# Patient Record
Sex: Male | Born: 1950 | Race: White | Hispanic: No | Marital: Single | Smoking: Never smoker
Health system: Southern US, Community
[De-identification: ages and names within clinical notes are randomized; demographics above are authoritative.]

## PROBLEM LIST (undated history)

## (undated) DIAGNOSIS — M109 Gout, unspecified: Secondary | ICD-10-CM

## (undated) DIAGNOSIS — E7800 Pure hypercholesterolemia, unspecified: Secondary | ICD-10-CM

## (undated) DIAGNOSIS — K219 Gastro-esophageal reflux disease without esophagitis: Secondary | ICD-10-CM

## (undated) DIAGNOSIS — T753XXA Motion sickness, initial encounter: Secondary | ICD-10-CM

## (undated) DIAGNOSIS — Z972 Presence of dental prosthetic device (complete) (partial): Secondary | ICD-10-CM

## (undated) DIAGNOSIS — I10 Essential (primary) hypertension: Secondary | ICD-10-CM

## (undated) HISTORY — DX: Essential (primary) hypertension: I10

---

## 2004-12-09 ENCOUNTER — Ambulatory Visit: Payer: Self-pay

## 2016-09-21 ENCOUNTER — Other Ambulatory Visit: Payer: Self-pay | Admitting: Nurse Practitioner

## 2016-09-21 DIAGNOSIS — K209 Esophagitis, unspecified without bleeding: Secondary | ICD-10-CM

## 2016-09-21 DIAGNOSIS — R1310 Dysphagia, unspecified: Secondary | ICD-10-CM

## 2016-09-28 ENCOUNTER — Ambulatory Visit: Payer: Self-pay

## 2016-09-28 HISTORY — PX: COLONOSCOPY WITH ESOPHAGOGASTRODUODENOSCOPY (EGD): SHX5779

## 2018-04-23 ENCOUNTER — Encounter: Payer: Self-pay | Admitting: Internal Medicine

## 2018-04-23 ENCOUNTER — Inpatient Hospital Stay: Payer: Medicare HMO | Attending: Internal Medicine | Admitting: Internal Medicine

## 2018-04-23 DIAGNOSIS — D472 Monoclonal gammopathy: Secondary | ICD-10-CM | POA: Diagnosis present

## 2018-04-23 DIAGNOSIS — D649 Anemia, unspecified: Secondary | ICD-10-CM | POA: Insufficient documentation

## 2018-04-23 DIAGNOSIS — Z7289 Other problems related to lifestyle: Secondary | ICD-10-CM

## 2018-04-23 NOTE — Assessment & Plan Note (Addendum)
#  M protein 0.2 g/dL; incidental-mild anemia; no evidence of any other organ dysfunction or neuropathy.   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 Patient is less likely at this time patient has any active myeloma-although given anemia [see discussion below]  # MILD ANEMIA- Hb 13 [N-14]; likely secondary to alcohol.  Recommend alcohol in moderation.  Clinically not related to slightly elevated M protein.  #Follow-up in 6 months labs-1 week prior [CBC CMP M protein kappa lambda]  Thank you Dr.Theis for allowing me to participate in the care of your pleasant patient. Please do not hesitate to contact me with questions or concerns in the interim.  # 45 minutes face-to-face with the patient discussing the above plan of care; more than 50% of time spent on prognosis/ natural history; counseling and coordination.  Cc: Dr.Theis.  

## 2018-04-23 NOTE — Progress Notes (Signed)
[facility] NOTE  Patient Care Team: Ezequiel Kayser, MD as PCP - General (Internal Medicine)  CHIEF COMPLAINTS/PURPOSE OF CONSULTATION: MGUS  #  MGUS- M protein 0.2 gm/dl [incidental; PCP]; Hb-13; Normal renal/ca.  # Hx of alcohol  # screening: colo 1 year  No history exists.    HISTORY OF PRESENTING ILLNESS:  Eric Henderson 67 y.o.  male no significant past medical history except for hypertension hyperlipidemia has been referred to [place] for further evaluation recommendations for abnormal M protein/mild anemia.  Patient denies any unusual bone pain.  Denies any tingling or numbness.  No nausea no vomiting.  No weight loss.  Complains of mild fatigue.   Review of Systems  Constitutional: Positive for malaise/fatigue. Negative for chills, diaphoresis, fever and weight loss.  HENT: Negative for nosebleeds and sore throat.   Eyes: Negative for double vision.  Respiratory: Negative for cough, hemoptysis, sputum production, shortness of breath and wheezing.   Cardiovascular: Negative for chest pain, palpitations, orthopnea and leg swelling.  Gastrointestinal: Negative for abdominal pain, blood in stool, constipation, diarrhea, heartburn, melena, nausea and vomiting.  Genitourinary: Negative for dysuria, frequency and urgency.  Musculoskeletal: Negative for back pain and joint pain.  Skin: Negative.  Negative for itching and rash.  Neurological: Negative for dizziness, tingling, focal weakness, weakness and headaches.  Endo/Heme/Allergies: Does not bruise/bleed easily.  Psychiatric/Behavioral: Negative for depression. The patient is not nervous/anxious and does not have insomnia.      MEDICAL HISTORY:  Past Medical History:  Diagnosis Date  . Hypertension     SURGICAL HISTORY: History reviewed. No pertinent surgical history.  SOCIAL HISTORY: no smoking; drink alcohol [beer/liqor]l 6 drinks/day; in [place] lives with girl friend Social History    Socioeconomic History  . Marital status: Married    Spouse name: Not on file  . Number of children: Not on file  . Years of education: Not on file  . Highest education level: Not on file  Occupational History  . Not on file  Social Needs  . Financial resource strain: Not on file  . Food insecurity:    Worry: Not on file    Inability: Not on file  . Transportation needs:    Medical: Not on file    Non-medical: Not on file  Tobacco Use  . Smoking status: Never Smoker  . Smokeless tobacco: Never Used  Substance and Sexual Activity  . Alcohol use: Yes    Comment: 10 drinks daily/ beer & vodka  . Drug use: Never  . Sexual activity: Not on file  Lifestyle  . Physical activity:    Days per week: Not on file    Minutes per session: Not on file  . Stress: Not on file  Relationships  . Social connections:    Talks on phone: Not on file    Gets together: Not on file    Attends religious service: Not on file    Active member of club or organization: Not on file    Attends meetings of clubs or organizations: Not on file    Relationship status: Not on file  . Intimate partner violence:    Fear of current or ex partner: Not on file    Emotionally abused: Not on file    Physically abused: Not on file    Forced sexual activity: Not on file  Other Topics Concern  . Not on file  Social History Narrative  . Not on file  FAMILY HISTORY: no family hx of cancer No family history on file.  ALLERGIES:  has No Known Allergies.  MEDICATIONS:  Current Outpatient Medications  Medication Sig Dispense Refill  . lisinopril-hydrochlorothiazide (PRINZIDE,ZESTORETIC) 10-12.5 MG tablet TK 1 T PO QD  1  . lovastatin (MEVACOR) 40 MG tablet   1  . metoprolol succinate (TOPROL-XL) 25 MG 24 hr tablet Take by mouth.    . Omega-3 Fatty Acids (FISH OIL) 1000 MG CAPS Take by mouth.    Marland Kitchen omeprazole (PRILOSEC) 40 MG capsule TAKE 1 CAPSULE (40 MG TOTAL) BY MOUTH ONCE DAILY PATIENT NEEDS AN  APPOINTMENT FOR FURTHER REFILLS.  0  . vitamin B-12 (CYANOCOBALAMIN) 1000 MCG tablet Take by mouth.     No current facility-administered medications for this visit.       Marland Kitchen  PHYSICAL EXAMINATION: ECOG PERFORMANCE STATUS: 0 - Asymptomatic  Vitals:   04/23/18 1353  BP: 116/78  Pulse: 84  Resp: 16  Temp: 97.9 F (36.6 C)   Filed Weights   04/23/18 1353  Weight: 217 lb 13 oz (98.8 kg)    Physical Exam  Constitutional: He is oriented to person, place, and time and well-developed, well-nourished, and in no distress.  HENT:  Head: Normocephalic and atraumatic.  Mouth/Throat: Oropharynx is clear and moist. No oropharyngeal exudate.  Eyes: Pupils are equal, round, and reactive to light.  Neck: Normal range of motion. Neck supple.  Cardiovascular: Normal rate and regular rhythm.  Pulmonary/Chest: No respiratory distress. He has no wheezes.  Abdominal: Soft. Bowel sounds are normal. He exhibits no distension and no mass. There is no tenderness. There is no rebound and no guarding.  Musculoskeletal: Normal range of motion. He exhibits no edema or tenderness.  Neurological: He is alert and oriented to person, place, and time.  Skin: Skin is warm.  Psychiatric: Affect normal.     LABORATORY DATA:  I have reviewed the data as listed No results found for: WBC, HGB, HCT, MCV, PLT No results for input(s): NA, K, CL, CO2, GLUCOSE, BUN, CREATININE, CALCIUM, GFRNONAA, GFRAA, PROT, ALBUMIN, AST, ALT, ALKPHOS, BILITOT, BILIDIR, IBILI in the last 8760 hours.  RADIOGRAPHIC STUDIES: I have personally reviewed the radiological images as listed and agreed with the findings in the report. No results found.  ASSESSMENT & PLAN:   MGUS (monoclonal gammopathy of unknown significance) #M protein 0.2 g/dL; incidental-mild anemia; no evidence of any other organ dysfunction or neuropathy.   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 Patient is less likely at this time patient has any active myeloma-although given anemia [see discussion below]  # MILD ANEMIA- Hb 13 [N-14]; likely secondary to alcohol.  Recommend alcohol in moderation.  Clinically not related to slightly elevated M protein.  #Follow-up in 6 months labs-1 week prior [facility] CMP M protein kappa lambda]  Thank you Dr.Theis for allowing me to participate in the care of your pleasant patient. Please do not hesitate to contact me with questions or concerns in the interim.  # 45 minutes face-to-face with the patient discussing the above plan of care; more than 50% of time spent on prognosis/ natural history; counseling and coordination.  Cc: Dr.Theis.   All questions were answered. The patient knows to call the clinic with any problems, questions or concerns.    Cammie Sickle, MD 04/23/2018 2:40 PM

## 2018-10-06 ENCOUNTER — Other Ambulatory Visit: Payer: Self-pay

## 2018-10-06 ENCOUNTER — Ambulatory Visit
Admission: EM | Admit: 2018-10-06 | Discharge: 2018-10-06 | Disposition: A | Discharge status: Home / Self Care | Payer: Medicare HMO | Attending: Emergency Medicine | Admitting: Emergency Medicine

## 2018-10-06 ENCOUNTER — Encounter: Payer: Self-pay | Admitting: Emergency Medicine

## 2018-10-06 DIAGNOSIS — J111 Influenza due to unidentified influenza virus with other respiratory manifestations: Secondary | ICD-10-CM

## 2018-10-06 DIAGNOSIS — R69 Illness, unspecified: Principal | ICD-10-CM

## 2018-10-06 DIAGNOSIS — J101 Influenza due to other identified influenza virus with other respiratory manifestations: Secondary | ICD-10-CM

## 2018-10-06 LAB — RAPID INFLUENZA A&B ANTIGENS: Influenza B (ARMC): NEGATIVE

## 2018-10-06 LAB — RAPID INFLUENZA A&B ANTIGENS (ARMC ONLY): INFLUENZA A (ARMC): POSITIVE — AB

## 2018-10-06 MED ORDER — BALOXAVIR MARBOXIL(80 MG DOSE) 2 X 40 MG PO TBPK: 2.0000 | ORAL_TABLET | Freq: Once | ORAL | 0 refills | Status: AC

## 2018-10-06 MED ORDER — OSELTAMIVIR PHOSPHATE 75 MG PO CAPS: 75.0000 mg | ORAL_CAPSULE | Freq: Two times a day (BID) | ORAL | 0 refills | Status: DC

## 2018-10-06 MED ORDER — BENZONATATE 200 MG PO CAPS: ORAL_CAPSULE | ORAL | 0 refills | Status: DC

## 2018-10-06 MED ORDER — HYDROCOD POLST-CPM POLST ER 10-8 MG/5ML PO SUER: 5.0000 mL | Freq: Two times a day (BID) | ORAL | 0 refills | Status: DC

## 2018-10-06 NOTE — ED Triage Notes (Signed)
Patient c/o cough, chest congestion, fever, and bodyaches that started 2-3 days.

## 2018-10-06 NOTE — ED Provider Notes (Signed)
[facility]    CSN: [id number] Arrival date & time: 10/06/18  3810     History   Chief Complaint Chief Complaint  Patient presents with  . Cough    APPT  . Fever  . Generalized Body Aches    HPI Eric Henderson is a 68 y.o. male.   HPI  -year-old male accompanied by his partner presents with a cough, chest congestion, fever and body aches that started 2 days ago.  His girlfriend states that last night he was very hot in bed but did not take his temperature.  His pulse rate was rather high  according to her.  He did receive a flu shot earlier this year.  Temperature today is 100F pulse rate of 105 respirations 16 O2 sats at 97%.         Past Medical History:  Diagnosis Date  . Hypertension     Patient Active Problem List   Diagnosis Date Noted  . MGUS (monoclonal gammopathy of unknown significance) 04/23/2018    History reviewed. No pertinent surgical history.     Home Medications    Prior to Admission medications   Medication Sig Start Date End Date Taking? Authorizing Provider  lisinopril-hydrochlorothiazide (PRINZIDE,ZESTORETIC) 10-12.5 MG tablet TK 1 T PO QD 01/20/18  Yes [provider]  lovastatin (MEVACOR) 40 MG tablet  01/20/18  Yes [provider]  metoprolol succinate (TOPROL-XL) 25 MG 24 hr tablet Take by mouth. 04/05/18  Yes [provider]  Omega-3 Fatty Acids (FISH OIL) 1000 MG CAPS Take by mouth.   Yes [provider]  omeprazole (PRILOSEC) 40 MG capsule TAKE 1 CAPSULE (40 MG TOTAL) BY MOUTH ONCE DAILY PATIENT NEEDS AN APPOINTMENT FOR FURTHER REFILLS. 03/11/18  Yes [provider]  vitamin B-12 (CYANOCOBALAMIN) 1000 MCG tablet Take by mouth.   Yes [provider]  Baloxavir Marboxil,80 MG Dose, (XOFLUZA) 2 x 40 MG TBPK Take 2 tablets by mouth once for 1 dose. 10/06/18 10/06/18  Lorin Picket, PA-C  benzonatate (TESSALON) 200 MG capsule Take one cap TID PRN cough 10/06/18   Lorin Picket, PA-C  chlorpheniramine-HYDROcodone [facility] ER) 10-8 MG/5ML SUER Take 5 mLs by mouth 2 (two) times daily. 10/06/18   Lorin Picket, PA-C  oseltamivir (TAMIFLU) 75 MG capsule Take 1 capsule (75 mg total) by mouth every 12 (twelve) hours. 10/06/18   Lorin Picket, PA-C    Family History History reviewed. No pertinent family history.  Social History Social History   Tobacco Use  . Smoking status: Never Smoker  . Smokeless tobacco: Never Used  Substance Use Topics  . Alcohol use: Yes    Comment: 10 drinks daily/ beer & vodka  . Drug use: Never     Allergies   Patient has no known allergies.   Review of Systems Review of Systems  Constitutional: Positive for activity change, chills, fatigue and fever.  HENT: Positive for congestion, postnasal drip and rhinorrhea.   Respiratory: Positive for cough.   All other systems reviewed and are negative.    Physical Exam Triage Vital Signs ED Triage Vitals  Enc Vitals Group     BP 10/06/18 0916 104/80     Pulse Rate 10/06/18 0916 (!) 105     Resp 10/06/18 0916 16     Temp 10/06/18 0916 100 F (37.8 C)     Temp Source 10/06/18 0916 Oral     SpO2 10/06/18 0916 97 %  Weight 10/06/18 0913 220 lb (99.8 kg)     Height 10/06/18 0913 5\' 8"  (1.727 m)     Head Circumference --      Peak Flow --      Pain Score 10/06/18 0913 7     Pain Loc --      Pain Edu? --      Excl. in [place]? --    No data found.  Updated Vital Signs BP 104/80 (BP Location: Left Arm)   Pulse (!) 105   Temp 100 F (37.8 C) (Oral) Comment: Patient had Tylenol 1 hour ago.  Resp 16   Ht 5\' 8"  (1.727 m)   Wt 220 lb (99.8 kg)   SpO2 97%   BMI 33.45 kg/m   Visual Acuity Right Eye Distance:   Left Eye Distance:   Bilateral Distance:    Right Eye Near:   Left Eye Near:    Bilateral Near:     Physical Exam Vitals signs and nursing note reviewed.  Constitutional:      General: He is not in acute distress.    Appearance:  Normal appearance. He is not ill-appearing, toxic-appearing or diaphoretic.  HENT:     Head: Normocephalic and atraumatic.     Right Ear: Tympanic membrane, ear canal and external ear normal.     Left Ear: Tympanic membrane, ear canal and external ear normal.     Nose: Nose normal.     Mouth/Throat:     Mouth: Mucous membranes are moist.     Pharynx: Oropharynx is clear.  Eyes:     General:        Right eye: No discharge.        Left eye: No discharge.     Conjunctiva/sclera: Conjunctivae normal.  Neck:     Musculoskeletal: Normal range of motion and neck supple.  Pulmonary:     Effort: Pulmonary effort is normal.     Breath sounds: Normal breath sounds.  Musculoskeletal: Normal range of motion.  Lymphadenopathy:     Cervical: No cervical adenopathy.  Skin:    General: Skin is warm and dry.  Neurological:     General: No focal deficit present.     Mental Status: He is alert and oriented to person, place, and time.  Psychiatric:        Mood and Affect: Mood normal.        Behavior: Behavior normal.        Thought Content: Thought content normal.        Judgment: Judgment normal.      UC Treatments / Results  Labs (all labs ordered are listed, but only abnormal results are displayed) Labs Reviewed  RAPID INFLUENZA A&B ANTIGENS ([facility] ONLY) - Abnormal; Notable for the following components:      Result Value   Influenza A ([facility]) POSITIVE (*)    All other components within normal limits    EKG None  Radiology No results found.  Procedures Procedures (including critical care time)  Medications Ordered in UC Medications - No data to display  Initial Impression / Assessment and Plan / UC Course  I have reviewed the triage vital signs and the nursing notes.  Pertinent labs & imaging results that were available during my care of the patient were reviewed by me and considered in my medical decision making (see chart for details).   She has positive flu a.  Treat  with Tamiflu.  Also provide him with a cough suppressants.  Needs to drink plenty of fluids get adequate rest and take Tylenol or Motrin for fever chills or body aches   Final Clinical Impressions(s) / UC Diagnoses   Final diagnoses:  Influenza-like illness     Discharge Instructions     Drink plenty of fluids.  Rest as much as possible.  Use Tylenol or Motrin for fever chills or body aches.    ED Prescriptions    Medication Sig Dispense Auth. Provider   Baloxavir Marboxil,80 MG Dose, (XOFLUZA) 2 x 40 MG TBPK Take 2 tablets by mouth once for 1 dose. 1 each Lorin Picket, PA-C   oseltamivir (TAMIFLU) 75 MG capsule Take 1 capsule (75 mg total) by mouth every 12 (twelve) hours. 10 capsule Lorin Picket, PA-C   benzonatate (TESSALON) 200 MG capsule Take one cap TID PRN cough 30 capsule Lorin Picket, PA-C   chlorpheniramine-HYDROcodone (TUSSIONEX PENNKINETIC ER) 10-8 MG/5ML SUER Take 5 mLs by mouth 2 (two) times daily. 115 mL Lorin Picket, PA-C     Controlled Substance Prescriptions [place] Controlled Substance Registry consulted? Not Applicable   Lorin Picket, PA-C 10/06/18 5638

## 2018-10-06 NOTE — Discharge Instructions (Signed)
Drink plenty of fluids.  Rest as much as possible.  Use Tylenol or Motrin for fever chills or body aches.  °

## 2018-10-22 ENCOUNTER — Inpatient Hospital Stay: Payer: Medicare HMO | Admitting: Internal Medicine

## 2018-10-22 ENCOUNTER — Inpatient Hospital Stay: Payer: Medicare HMO

## 2018-10-28 ENCOUNTER — Inpatient Hospital Stay: Payer: Medicare HMO

## 2018-10-29 ENCOUNTER — Inpatient Hospital Stay: Payer: Medicare HMO | Attending: Urgent Care

## 2018-10-29 DIAGNOSIS — D649 Anemia, unspecified: Secondary | ICD-10-CM | POA: Diagnosis not present

## 2018-10-29 DIAGNOSIS — D472 Monoclonal gammopathy: Secondary | ICD-10-CM | POA: Diagnosis present

## 2018-10-29 LAB — COMPREHENSIVE METABOLIC PANEL
ALT: 19 U/L (ref 0–44)
ANION GAP: 5 (ref 5–15)
AST: 21 U/L (ref 15–41)
Albumin: 3.7 g/dL (ref 3.5–5.0)
Alkaline Phosphatase: 52 U/L (ref 38–126)
BUN: 17 mg/dL (ref 8–23)
CO2: 28 mmol/L (ref 22–32)
Calcium: 8.6 mg/dL — ABNORMAL LOW (ref 8.9–10.3)
Chloride: 104 mmol/L (ref 98–111)
Creatinine, Ser: 0.99 mg/dL (ref 0.61–1.24)
GFR calc Af Amer: >60 mL/min (ref >60)
GFR calc non Af Amer: >60 mL/min (ref >60)
Glucose, Bld: 110 mg/dL — ABNORMAL HIGH (ref 70–99)
Potassium: 4.1 mmol/L (ref 3.5–5.1)
Sodium: 137 mmol/L (ref 135–145)
TOTAL PROTEIN: 7.2 g/dL (ref 6.5–8.1)
Total Bilirubin: 1.1 mg/dL (ref 0.3–1.2)

## 2018-10-29 LAB — CBC WITH DIFFERENTIAL/PLATELET
Abs Immature Granulocytes: 0.02 10*3/uL (ref 0.00–0.07)
Basophils Absolute: 0 10*3/uL (ref 0.0–0.1)
Basophils Relative: 1 %
Eosinophils Absolute: 0.1 10*3/uL (ref 0.0–0.5)
Eosinophils Relative: 2 %
HCT: 38.2 % — ABNORMAL LOW (ref 39.0–52.0)
Hemoglobin: 12.6 g/dL — ABNORMAL LOW (ref 13.0–17.0)
Immature Granulocytes: 0 %
Lymphocytes Relative: 48 %
Lymphs Abs: 2.4 10*3/uL (ref 0.7–4.0)
MCH: 32.6 pg (ref 26.0–34.0)
MCHC: 33 g/dL (ref 30.0–36.0)
MCV: 99 fL (ref 80.0–100.0)
Monocytes Absolute: 0.7 10*3/uL (ref 0.1–1.0)
Monocytes Relative: 13 %
Neutro Abs: 1.8 10*3/uL (ref 1.7–7.7)
Neutrophils Relative %: 36 %
Platelets: 233 10*3/uL (ref 150–400)
RBC: 3.86 MIL/uL — ABNORMAL LOW (ref 4.22–5.81)
RDW: 13.1 % (ref 11.5–15.5)
WBC: 5 10*3/uL (ref 4.0–10.5)
nRBC: 0 % (ref 0.0–0.2)

## 2018-10-30 LAB — KAPPA/LAMBDA LIGHT CHAINS
Kappa free light chain: 23.4 mg/L — ABNORMAL HIGH (ref 3.3–19.4)
Kappa, lambda light chain ratio: 0.84 (ref 0.26–1.65)
Lambda free light chains: 28 mg/L — ABNORMAL HIGH (ref 5.7–26.3)

## 2018-10-30 LAB — MULTIPLE MYELOMA PANEL, SERUM
Albumin SerPl Elph-Mcnc: 3.5 g/dL (ref 2.9–4.4)
Albumin/Glob SerPl: 1.3 (ref 0.7–1.7)
Alpha 1: 0.3 g/dL (ref 0.0–0.4)
Alpha2 Glob SerPl Elph-Mcnc: 0.7 g/dL (ref 0.4–1.0)
B-GLOBULIN SERPL ELPH-MCNC: 1.3 g/dL (ref 0.7–1.3)
GLOBULIN, TOTAL: 2.9 g/dL (ref 2.2–3.9)
Gamma Glob SerPl Elph-Mcnc: 0.7 g/dL (ref 0.4–1.8)
IgA: 526 mg/dL — ABNORMAL HIGH (ref 61–437)
IgG (Immunoglobin G), Serum: 946 mg/dL (ref 700–1600)
IgM (Immunoglobulin M), Srm: 148 mg/dL (ref 20–172)
M PROTEIN SERPL ELPH-MCNC: 0.3 g/dL — AB
Total Protein ELP: 6.4 g/dL (ref 6.0–8.5)

## 2018-10-31 ENCOUNTER — Other Ambulatory Visit: Payer: Self-pay

## 2018-10-31 DIAGNOSIS — D472 Monoclonal gammopathy: Secondary | ICD-10-CM

## 2018-11-02 NOTE — Progress Notes (Signed)
[facility] day:  11/04/2018  Chief Complaint: Eric Henderson is a 68 y.o. male with an IgA monoclonal gammopathy who is seen for new patient assessment.  HPI:   He was seen by Dr Raechel Ache on 04/05/2018.  He was noted to have chronic anemia.  CBC revealed a hematocrit of 40.2, hemoglobin 13.4, and MCV 99.  B12 was 545 and ferritin 60.  M-spike was 0.2 gm/dL on 04/05/2018.  Calcium and creatinine were normal.     The patient was last seen in the hematology clinic by Dr. Rogue Bussing on 04/23/2018 for initial assessment.  He was referred for a monoclonal protein noted incidentally.  Symptomatically, he noted mild fatigue.  He was felt to have a monoclonal gammopathy of unknown significance (MGUS) with no end organ dysfunction.  Alcohol in moderation was suggested as well as surveillance.  Labs on 10/29/2018 included a hematocrit of 38.2, hemoglobin 12.6, MCV 99.0, platelets 233,000, WBC 5000 with an ANC of 1800.  Creatinine was 0.99.  Calcium was 8.6.  LFTs were normal.  SPEP revealed a 0.3 gm/dL IgA monoclonal protein with lambda light chain specificity.  IgA was 526 (61-437).  IgG was 946.  Kappa free light chains were 23.4, lambda free light chains were 28.0 with a ratio of 0.84 (normal).  During the interim, he has done well.  He notes "I'm ok, I am always tired.  I am just worn out".  He states that he remains active working.  He lays brick.  He denies any B symptoms.  He drinks "4-5 Vodka and tonic per night and a smattering of beer".  He states that his diet is mostly burgers and a "smattering of salads".   Past Medical History:  Diagnosis Date  . Hypertension     History reviewed. No pertinent surgical history.  History reviewed. No pertinent family history.  Social History:  reports that he has never smoked. He has never used smokeless tobacco. He reports current alcohol use. He reports that he does not use drugs.  He drinks 4-5 Vodka and tonic  per night and a smattering of beer.  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  He works with a buddy on Music therapist, Social research officer, government. He works variable hours depending on the job.  He lives in [place].  The patient is alone today.  Allergies: No Known Allergies  Current Medications: Current Outpatient Medications  Medication Sig Dispense Refill  . lisinopril-hydrochlorothiazide (PRINZIDE,ZESTORETIC) 10-12.5 MG tablet TK 1 T PO QD  1  . lovastatin (MEVACOR) 40 MG tablet Take 40 mg by mouth at bedtime.   1  . metoprolol succinate (TOPROL-XL) 25 MG 24 hr tablet Take 25 mg by mouth daily.     . Omega-3 Fatty Acids (FISH OIL) 1000 MG CAPS Take 1 capsule by mouth daily.     Marland Kitchen omeprazole (PRILOSEC) 40 MG capsule TAKE 1 CAPSULE (40 MG TOTAL) BY MOUTH ONCE DAILY PATIENT NEEDS AN APPOINTMENT FOR FURTHER REFILLS.  0  . vitamin B-12 (CYANOCOBALAMIN) 1000 MCG tablet Take 1,000 mcg by mouth daily.      No current facility-administered medications for this visit.     Review of Systems:  GENERAL:  Feels "worn out" secondary to work.  No fevers, sweats.  Weight up 1 pound. PERFORMANCE STATUS (ECOG):  1 HEENT:  No visual changes, runny nose, sore throat, mouth sores or tenderness. Lungs: No shortness of breath or cough.  No hemoptysis.  Cardiac:  No chest pain, palpitations, orthopnea, or PND. GI:  No nausea, vomiting, diarrhea, constipation, melena or hematochezia. GU:  No urgency, frequency, dysuria, or hematuria. Musculoskeletal:  No back pain.  Knees hurt secondary to bending a lot.  No muscle tenderness. Extremities:  No pain or swelling. Skin:  No rashes or skin changes. Neuro:  No headache, numbness or weakness, balance or coordination issues. Endocrine:  No diabetes, thyroid issues, hot flashes or night sweats. Psych:  No mood changes, depression or anxiety. Pain:  No focal pain. Review of systems:  All other systems reviewed and found to be negative.  Physical Exam: Blood pressure  126/80, pulse (!) 59, temperature 97.7 F (36.5 C), resp. rate 16, weight 218 lb 7.6 oz (99.1 kg), SpO2 99 %. GENERAL:  Well developed, well nourished, gentleman sitting comfortably in the exam room in no acute distress. MENTAL STATUS:  Alert and oriented to person, place and time. HEAD:  Lilyan Punt and mustache.  Normocephalic, atraumatic, face symmetric, no Cushingoid features. EYES:  Blue eyes.  Pupils equal round and reactive to light and accomodation.  No conjunctivitis or scleral icterus. ENT:  Oropharynx clear without lesion.  Tongue normal. Mucous membranes moist.  RESPIRATORY:  Clear to auscultation without rales, wheezes or rhonchi. CARDIOVASCULAR:  Regular rate and rhythm without murmur, rub or gallop. ABDOMEN:  Soft, non-tender, with active bowel sounds, and no appreciable hepatosplenomegaly.  No masses. SKIN:  No rashes, ulcers or lesions. EXTREMITIES: No edema, no skin discoloration or tenderness.  No palpable cords. LYMPH NODES: No palpable cervical, supraclavicular, axillary or inguinal adenopathy  NEUROLOGICAL: Unremarkable. PSYCH:  Appropriate.   No visits with results within 3 Day(s) from this visit.  Latest known visit with results is:  Appointment on 10/29/2018  Component Date Value Ref Range Status  . IgG (Immunoglobin G), Serum 10/29/2018 946  700 - 1,600 mg/dL Final   Comment: (NOTE)    **Effective November 18, 2018, Immunoglobulin G, Qn,**      Serum reference interval will be changing to:             Age                Male          Male         0  - 10 days        496 - 9562     130 - 1231        11d -  6 months      175 -  639     184 -  697          7 - 11 months      261 -  865     784 -  787          1 -  3 years       428 - 6962     952 - 8413          4 -  6 years       538 - 2440     102 - 7253          6 -  9 years       72 - 6440     347 - 1350         10 - 11 years       601 - 4259     563 - 8756  12 - 13 years       610 - 2426     834 -  1433         14 - 15 years       630 - 1962     229 - 7989         21 - 19 years       671 - 1941     740 - 8144             >19 years       603 - 1613     586 - 1602   . IgA 10/29/2018 526* 61 - 437 mg/dL Final  . IgM (Immunoglobulin M), Srm 10/29/2018 148  20 - 172 mg/dL Final  . Total Protein ELP 10/29/2018 6.4  6.0 - 8.5 g/dL Corrected  . Albumin SerPl Elph-Mcnc 10/29/2018 3.5  2.9 - 4.4 g/dL Corrected  . Alpha 1 10/29/2018 0.3  0.0 - 0.4 g/dL Corrected  . Alpha2 Glob SerPl Elph-Mcnc 10/29/2018 0.7  0.4 - 1.0 g/dL Corrected  . B-Globulin SerPl Elph-Mcnc 10/29/2018 1.3  0.7 - 1.3 g/dL Corrected  . Gamma Glob SerPl Elph-Mcnc 10/29/2018 0.7  0.4 - 1.8 g/dL Corrected  . M Protein SerPl Elph-Mcnc 10/29/2018 0.3* Not Observed g/dL Corrected  . Globulin, Total 10/29/2018 2.9  2.2 - 3.9 g/dL Corrected  . Albumin/Glob SerPl 10/29/2018 1.3  0.7 - 1.7 Corrected  . IFE 1 10/29/2018 Comment   Corrected   Comment: (NOTE) Immunofixation shows IgA monoclonal protein with lambda light chain specificity.   . Please Note 10/29/2018 Comment   Corrected   Comment: (NOTE) Protein electrophoresis scan will follow via computer, mail, or courier delivery. Performed At: [facility] [place], [place] [id number] Rush Farmer MD [id number]   . Kappa free light chain 10/29/2018 23.4* 3.3 - 19.4 mg/L Final  . Lamda free light chains 10/29/2018 28.0* 5.7 - 26.3 mg/L Final  . Kappa, lamda light chain ratio 10/29/2018 0.84  0.26 - 1.65 Final   Comment: (NOTE) Performed At: [facility] [place], [place] [id number] Rush Farmer MD [id number]   . Sodium 10/29/2018 137  135 - 145 mmol/L Final  . Potassium 10/29/2018 4.1  3.5 - 5.1 mmol/L Final  . Chloride 10/29/2018 104  98 - 111 mmol/L Final  . CO2 10/29/2018 28  22 - 32 mmol/L Final  . Glucose, Bld 10/29/2018 110* 70 - 99 mg/dL Final  . BUN 10/29/2018 17  8 - 23 mg/dL Final  . Creatinine, Ser  10/29/2018 0.99  0.61 - 1.24 mg/dL Final  . Calcium 10/29/2018 8.6* 8.9 - 10.3 mg/dL Final  . Total Protein 10/29/2018 7.2  6.5 - 8.1 g/dL Final  . Albumin 10/29/2018 3.7  3.5 - 5.0 g/dL Final  . AST 10/29/2018 21  15 - 41 U/L Final  . ALT 10/29/2018 19  0 - 44 U/L Final  . Alkaline Phosphatase 10/29/2018 52  38 - 126 U/L Final  . Total Bilirubin 10/29/2018 1.1  0.3 - 1.2 mg/dL Final  . GFR calc non Af Amer 10/29/2018 >60  >60 mL/min Final  . GFR calc Af Amer 10/29/2018 >60  >60 mL/min Final  . Anion gap 10/29/2018 5  5 - 15 Final   Performed at [facility] [place], [address] [place], [place] [address]  . WBC 10/29/2018 5.0  4.0 - 10.5 K/uL Final  . RBC 10/29/2018 3.86* 4.22 - 5.81 MIL/uL Final  .  Hemoglobin 10/29/2018 12.6* 13.0 - 17.0 g/dL Final  . HCT 10/29/2018 38.2* 39.0 - 52.0 % Final  . MCV 10/29/2018 99.0  80.0 - 100.0 fL Final  . MCH 10/29/2018 32.6  26.0 - 34.0 pg Final  . MCHC 10/29/2018 33.0  30.0 - 36.0 g/dL Final  . RDW 10/29/2018 13.1  11.5 - 15.5 % Final  . Platelets 10/29/2018 233  150 - 400 K/uL Final  . nRBC 10/29/2018 0.0  0.0 - 0.2 % Final  . Neutrophils Relative % 10/29/2018 36  % Final  . Neutro Abs 10/29/2018 1.8  1.7 - 7.7 K/uL Final  . Lymphocytes Relative 10/29/2018 48  % Final  . Lymphs Abs 10/29/2018 2.4  0.7 - 4.0 K/uL Final  . Monocytes Relative 10/29/2018 13  % Final  . Monocytes Absolute 10/29/2018 0.7  0.1 - 1.0 K/uL Final  . Eosinophils Relative 10/29/2018 2  % Final  . Eosinophils Absolute 10/29/2018 0.1  0.0 - 0.5 K/uL Final  . Basophils Relative 10/29/2018 1  % Final  . Basophils Absolute 10/29/2018 0.0  0.0 - 0.1 K/uL Final  . Immature Granulocytes 10/29/2018 0  % Final  . Abs Immature Granulocytes 10/29/2018 0.02  0.00 - 0.07 K/uL Final   Performed at [facility], [address], [place], [place] [address]    Assessment:  XYLAN SHEILS is a 68 y.o. male with an IgA monoclonal gammopathy with lambda light chain  specificity first noted in 03/2018 on evaluation of anemia.  Labs on 10/29/2018 included a hematocrit of 38.2, hemoglobin 12.6, MCV 99.0, platelets 233,000, WBC 5000 with an ANC of 1800.  Creatinine and calcium were normal.    SPEP has been followed (gm/dL):  0.2 on 04/05/2018 and 0.3 on 10/29/2018.  IgA was 526 (64-680) on 10/29/2018.  Lambda free light chains were 28.0 (ratio 0.84) on 10/29/2018.  Symptomatically, he feels "worn out" secondary to his job.  He denies any B symptoms or infections.  He denies any bone pain.  Exam is unremarkable.  Plan: 1.   Review labs from 10/29/2018.   2.   IgA monoclonal gammopathy  M-spike remains low.  No evidence of end organ damage (CRAB criteria).  Discuss IgA monoclonal gammopathies as compared to others.  Bone marrow considered in patients with IgA MGUS of any size.  For patients with M-spike < 1.5 gm/dL, marrow involvement >= 10% seen 5% - 20% in IgA gammopathies.  Discuss consideration of 12 hour UPEP with free light chains and bone survey.  Patient is in agreement.  Discuss follow-up every 6 months for MGUS and every 3 months for patients classified with smoldering myeloma. 3.   Collect 24 hour urine for UPEP and free light chains. 4.   Bone survey. 5.   RTC in 6 months for MD assessment, labs (CBC with diff, CMP, myeloma panel, FLCA, LDH, beta2-microglobulin).   Lequita Asal, MD  11/04/2018, 10:40 AM

## 2018-11-04 ENCOUNTER — Encounter: Payer: Self-pay | Admitting: Hematology and Oncology

## 2018-11-04 ENCOUNTER — Other Ambulatory Visit: Payer: Self-pay

## 2018-11-04 ENCOUNTER — Inpatient Hospital Stay (HOSPITAL_BASED_OUTPATIENT_CLINIC_OR_DEPARTMENT_OTHER): Payer: Medicare HMO | Admitting: Hematology and Oncology

## 2018-11-04 VITALS — BP 126/80 | HR 59 | Temp 97.7°F | Resp 16 | Wt 218.5 lb

## 2018-11-04 DIAGNOSIS — D649 Anemia, unspecified: Secondary | ICD-10-CM | POA: Diagnosis not present

## 2018-11-04 DIAGNOSIS — D472 Monoclonal gammopathy: Secondary | ICD-10-CM

## 2018-11-04 NOTE — Progress Notes (Signed)
Pt here for follow up. Reports lower abdominal pain that has been present for "awhile" that comes and goes. States he will make PCP aware of problem.

## 2019-05-07 ENCOUNTER — Encounter: Payer: Self-pay | Admitting: Hematology and Oncology

## 2019-05-07 ENCOUNTER — Inpatient Hospital Stay: Payer: Medicare HMO | Attending: Hematology and Oncology | Admitting: Hematology and Oncology

## 2019-05-07 ENCOUNTER — Inpatient Hospital Stay: Payer: Medicare HMO

## 2019-06-23 ENCOUNTER — Other Ambulatory Visit: Payer: Self-pay | Admitting: Physician Assistant

## 2019-06-23 DIAGNOSIS — M2392 Unspecified internal derangement of left knee: Secondary | ICD-10-CM

## 2019-06-23 DIAGNOSIS — G8929 Other chronic pain: Secondary | ICD-10-CM

## 2019-06-23 DIAGNOSIS — M25562 Pain in left knee: Secondary | ICD-10-CM

## 2019-07-03 ENCOUNTER — Other Ambulatory Visit: Payer: Self-pay

## 2019-07-03 ENCOUNTER — Ambulatory Visit
Admission: RE | Admit: 2019-07-03 | Discharge: 2019-07-03 | Disposition: A | Discharge status: Home / Self Care | Payer: Medicare HMO | Source: Ambulatory Visit | Attending: Physician Assistant | Admitting: Physician Assistant

## 2019-07-03 DIAGNOSIS — M25561 Pain in right knee: Secondary | ICD-10-CM | POA: Diagnosis present

## 2019-07-03 DIAGNOSIS — G8929 Other chronic pain: Secondary | ICD-10-CM | POA: Diagnosis present

## 2019-07-03 DIAGNOSIS — M2392 Unspecified internal derangement of left knee: Secondary | ICD-10-CM | POA: Diagnosis not present

## 2019-07-03 DIAGNOSIS — M25562 Pain in left knee: Secondary | ICD-10-CM | POA: Insufficient documentation

## 2019-07-30 ENCOUNTER — Other Ambulatory Visit: Payer: Self-pay | Admitting: Orthopedic Surgery

## 2019-08-20 ENCOUNTER — Other Ambulatory Visit: Payer: Self-pay

## 2019-08-20 ENCOUNTER — Encounter: Payer: Self-pay | Admitting: Orthopedic Surgery

## 2019-08-27 ENCOUNTER — Other Ambulatory Visit: Payer: Self-pay

## 2019-08-27 ENCOUNTER — Other Ambulatory Visit
Admission: RE | Admit: 2019-08-27 | Discharge: 2019-08-27 | Disposition: A | Discharge status: Home / Self Care | Payer: Medicare HMO | Source: Ambulatory Visit | Attending: Orthopedic Surgery | Admitting: Orthopedic Surgery

## 2019-08-27 DIAGNOSIS — Z20822 Contact with and (suspected) exposure to covid-19: Secondary | ICD-10-CM | POA: Diagnosis not present

## 2019-08-27 DIAGNOSIS — Z01812 Encounter for preprocedural laboratory examination: Secondary | ICD-10-CM | POA: Diagnosis present

## 2019-08-27 LAB — SARS CORONAVIRUS 2 (TAT 6-24 HRS): SARS Coronavirus 2: NEGATIVE

## 2019-08-29 ENCOUNTER — Ambulatory Visit
Admission: RE | Admit: 2019-08-29 | Discharge: 2019-08-29 | Disposition: A | Discharge status: Home / Self Care | Payer: Medicare HMO | Attending: Orthopedic Surgery | Admitting: Orthopedic Surgery

## 2019-08-29 ENCOUNTER — Encounter: Payer: Self-pay | Admitting: Orthopedic Surgery

## 2019-08-29 ENCOUNTER — Encounter
Admission: RE | Disposition: A | Discharge status: Home / Self Care | Payer: Self-pay | Source: Home / Self Care | Attending: Orthopedic Surgery

## 2019-08-29 ENCOUNTER — Other Ambulatory Visit: Payer: Self-pay

## 2019-08-29 ENCOUNTER — Ambulatory Visit: Payer: Medicare HMO | Admitting: Anesthesiology

## 2019-08-29 DIAGNOSIS — S83232A Complex tear of medial meniscus, current injury, left knee, initial encounter: Secondary | ICD-10-CM | POA: Insufficient documentation

## 2019-08-29 DIAGNOSIS — X501XXA Overexertion from prolonged static or awkward postures, initial encounter: Secondary | ICD-10-CM | POA: Diagnosis not present

## 2019-08-29 DIAGNOSIS — Z79899 Other long term (current) drug therapy: Secondary | ICD-10-CM | POA: Insufficient documentation

## 2019-08-29 DIAGNOSIS — I10 Essential (primary) hypertension: Secondary | ICD-10-CM | POA: Diagnosis not present

## 2019-08-29 DIAGNOSIS — Z6836 Body mass index (BMI) 36.0-36.9, adult: Secondary | ICD-10-CM | POA: Diagnosis not present

## 2019-08-29 DIAGNOSIS — E7800 Pure hypercholesterolemia, unspecified: Secondary | ICD-10-CM | POA: Diagnosis not present

## 2019-08-29 DIAGNOSIS — K219 Gastro-esophageal reflux disease without esophagitis: Secondary | ICD-10-CM | POA: Insufficient documentation

## 2019-08-29 HISTORY — DX: Gout, unspecified: M10.9

## 2019-08-29 HISTORY — DX: Motion sickness, initial encounter: T75.3XXA

## 2019-08-29 HISTORY — DX: Presence of dental prosthetic device (complete) (partial): Z97.2

## 2019-08-29 HISTORY — PX: KNEE ARTHROSCOPY WITH MEDIAL MENISECTOMY: SHX5651

## 2019-08-29 HISTORY — DX: Gastro-esophageal reflux disease without esophagitis: K21.9

## 2019-08-29 HISTORY — DX: Pure hypercholesterolemia, unspecified: E78.00

## 2019-08-29 SURGERY — ARTHROSCOPY, KNEE, WITH MEDIAL MENISCECTOMY
Anesthesia: General | Site: Knee | Laterality: Left

## 2019-08-29 MED ORDER — FENTANYL CITRATE (PF) 100 MCG/2ML IJ SOLN
INTRAMUSCULAR | Status: DC | PRN
  Administered 2019-08-29: 50 ug via INTRAVENOUS
  Administered 2019-08-29: 100 ug via INTRAVENOUS

## 2019-08-29 MED ORDER — IBUPROFEN 800 MG PO TABS: 800.0000 mg | ORAL_TABLET | Freq: Three times a day (TID) | ORAL | 1 refills | Status: AC

## 2019-08-29 MED ORDER — CEFAZOLIN SODIUM-DEXTROSE 2-4 GM/100ML-% IV SOLN
2.0000 g | INTRAVENOUS | Status: AC
  Administered 2019-08-29: 2 g via INTRAVENOUS

## 2019-08-29 MED ORDER — ONDANSETRON 4 MG PO TBDP: 4.0000 mg | ORAL_TABLET | Freq: Three times a day (TID) | ORAL | 0 refills | Status: DC | PRN

## 2019-08-29 MED ORDER — DEXAMETHASONE SODIUM PHOSPHATE 4 MG/ML IJ SOLN
INTRAMUSCULAR | Status: DC | PRN
  Administered 2019-08-29: 4 mg via INTRAVENOUS

## 2019-08-29 MED ORDER — GLYCOPYRROLATE 0.2 MG/ML IJ SOLN
INTRAMUSCULAR | Status: DC | PRN
  Administered 2019-08-29: .1 mg via INTRAVENOUS

## 2019-08-29 MED ORDER — LIDOCAINE-EPINEPHRINE 1 %-1:100000 IJ SOLN
INTRAMUSCULAR | Status: DC | PRN
  Administered 2019-08-29: 12 mL
  Administered 2019-08-29: 1.5 mL

## 2019-08-29 MED ORDER — MIDAZOLAM HCL 5 MG/5ML IJ SOLN
INTRAMUSCULAR | Status: DC | PRN
  Administered 2019-08-29: 2 mg via INTRAVENOUS

## 2019-08-29 MED ORDER — HYDROCODONE-ACETAMINOPHEN 5-325 MG PO TABS: 1.0000 | ORAL_TABLET | ORAL | 0 refills | Status: DC | PRN

## 2019-08-29 MED ORDER — ONDANSETRON HCL 4 MG/2ML IJ SOLN
INTRAMUSCULAR | Status: DC | PRN
  Administered 2019-08-29: 4 mg via INTRAVENOUS

## 2019-08-29 MED ORDER — BUPIVACAINE HCL (PF) 0.5 % IJ SOLN
INTRAMUSCULAR | Status: DC | PRN
  Administered 2019-08-29: 12 mL
  Administered 2019-08-29: 1.5 mL

## 2019-08-29 MED ORDER — LACTATED RINGERS IV SOLN: INTRAVENOUS | Status: DC

## 2019-08-29 MED ORDER — LABETALOL HCL 5 MG/ML IV SOLN
INTRAVENOUS | Status: DC | PRN
  Administered 2019-08-29: 5 mg via INTRAVENOUS

## 2019-08-29 MED ORDER — LIDOCAINE HCL (CARDIAC) PF 100 MG/5ML IV SOSY
PREFILLED_SYRINGE | INTRAVENOUS | Status: DC | PRN
  Administered 2019-08-29: 40 mg via INTRATRACHEAL

## 2019-08-29 MED ORDER — ASPIRIN EC 325 MG PO TBEC: 325.0000 mg | DELAYED_RELEASE_TABLET | Freq: Every day | ORAL | 0 refills | Status: AC

## 2019-08-29 MED ORDER — ACETAMINOPHEN 500 MG PO TABS: 1000.0000 mg | ORAL_TABLET | Freq: Three times a day (TID) | ORAL | 2 refills | Status: AC

## 2019-08-29 MED ORDER — PROPOFOL 10 MG/ML IV BOLUS
INTRAVENOUS | Status: DC | PRN
  Administered 2019-08-29: 200 mg via INTRAVENOUS

## 2019-08-29 MED ORDER — CHLORHEXIDINE GLUCONATE 4 % EX LIQD
60.0000 mL | Freq: Once | CUTANEOUS | Status: AC
  Administered 2019-08-29: 4 via TOPICAL

## 2019-08-29 SURGICAL SUPPLY — 37 items
ADAPTER IRRIG TUBE 2 SPIKE SOL (ADAPTER) ×3 IMPLANT
BLADE SURG SZ11 CARB STEEL (BLADE) ×2 IMPLANT
BNDG COHESIVE 4X5 TAN STRL (GAUZE/BANDAGES/DRESSINGS) ×2 IMPLANT
BNDG ESMARK 6X12 TAN STRL LF (GAUZE/BANDAGES/DRESSINGS) ×2 IMPLANT
BUR RADIUS 3.5 (BURR) IMPLANT
BUR RADIUS 4.0X18.5 (BURR) ×1 IMPLANT
CHLORAPREP W/TINT 26 (MISCELLANEOUS) ×2 IMPLANT
COOLER POLAR GLACIER W/PUMP (MISCELLANEOUS) ×2 IMPLANT
COVER LIGHT HANDLE UNIVERSAL (MISCELLANEOUS) ×4 IMPLANT
CUFF TOURN SGL QUICK 30 (TOURNIQUET CUFF) ×1
CUFF TRNQT CYL 30X4X21-28X (TOURNIQUET CUFF) IMPLANT
DRAPE IMP U-DRAPE 54X76 (DRAPES) ×2 IMPLANT
GAUZE SPONGE 4X4 12PLY STRL (GAUZE/BANDAGES/DRESSINGS) ×2 IMPLANT
GLOVE BIO SURGEON STRL SZ7.5 (GLOVE) ×3 IMPLANT
GLOVE BIOGEL PI IND STRL 8 (GLOVE) ×1 IMPLANT
GLOVE BIOGEL PI INDICATOR 8 (GLOVE) ×2
GOWN STRL REIN 2XL XLG LVL4 (GOWN DISPOSABLE) ×2 IMPLANT
GOWN STRL REUS W/ TWL LRG LVL3 (GOWN DISPOSABLE) ×1 IMPLANT
GOWN STRL REUS W/TWL LRG LVL3 (GOWN DISPOSABLE) ×1
IV LACTATED RINGER IRRG 3000ML (IV SOLUTION) ×4
IV LR IRRIG 3000ML ARTHROMATIC (IV SOLUTION) ×4 IMPLANT
KIT TURNOVER KIT A (KITS) ×2 IMPLANT
MANIFOLD NEPTUNE II (INSTRUMENTS) ×2 IMPLANT
MAT ABSORB  FLUID 56X50 GRAY (MISCELLANEOUS) ×1
MAT ABSORB FLUID 56X50 GRAY (MISCELLANEOUS) ×1 IMPLANT
PACK ARTHROSCOPY KNEE (MISCELLANEOUS) ×2 IMPLANT
PAD WRAPON POLAR KNEE (MISCELLANEOUS) ×1 IMPLANT
PADDING CAST BLEND 6X4 STRL (MISCELLANEOUS) ×1 IMPLANT
PADDING STRL CAST 6IN (MISCELLANEOUS) ×1
SET TUBE SUCT SHAVER OUTFL 24K (TUBING) ×2 IMPLANT
SET TUBE TIP INTRA-ARTICULAR (MISCELLANEOUS) ×2 IMPLANT
SUT ETHILON 3-0 FS-10 30 BLK (SUTURE) ×2
SUTURE EHLN 3-0 FS-10 30 BLK (SUTURE) ×1 IMPLANT
TOWEL OR 17X26 4PK STRL BLUE (TOWEL DISPOSABLE) ×3 IMPLANT
TUBING ARTHRO INFLOW-ONLY STRL (TUBING) ×2 IMPLANT
WAND WEREWOLF FLOW 90D (MISCELLANEOUS) ×1 IMPLANT
WRAPON POLAR PAD KNEE (MISCELLANEOUS) ×2

## 2019-08-29 NOTE — Op Note (Signed)
Operative Note    SURGERY DATE: 08/29/2019   PRE-OP DIAGNOSIS:  1. Left medial meniscus tear 2. Left tricompartmental degenerative changes   POST-OP DIAGNOSIS:  1. Left medial meniscus tear 2. Left tricompartmental degenerative changes   PROCEDURES:  1.  Left knee arthroscopy, partial medial meniscectomy 2.  Chondroplasty of patellofemora and medial compartments   SURGEON: Cato Mulligan, MD   ANESTHESIA: Gen   ESTIMATED BLOOD LOSS: minimal   TOTAL IV FLUIDS: per anesthesia   INDICATION(S):  Eric Henderson is a 69 y.o. male with signs and symptoms as well as MRI finding of medial meniscus tear.  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  He had failed nonoperative measures.  After discussion of risks, benefits, and alternatives to surgery, the patient elected to proceed.    OPERATIVE FINDINGS:    Examination under anesthesia: A careful examination under anesthesia was performed.  Passive range of motion was: Hyperextension: 2.  Extension: 0.  Flexion: 130.  Lachman: normal. Pivot Shift: normal.  Posterior drawer: normal.  Varus stability in full extension: normal.  Varus stability in 30 degrees of flexion: normal.  Valgus stability in full extension: normal.  Valgus stability in 30 degrees of flexion: normal.   Intra-operative findings: A thorough arthroscopic examination of the knee was performed.  The findings are: 1. Suprapatellar pouch: Normal 2. Undersurface of median ridge: Grade 2 degenerative changes 3. Medial patellar facet: Grade 1 softening 4. Lateral patellar facet: Grade 1 softening 5. Trochlea: Significant area of grade 4 degenerative changes centrally measuring approximately 12 x 10 mm 6. Lateral gutter/popliteus tendon: Normal 7. Hoffa's fat pad: Inflamed 8. Medial gutter/plica: Normal 9. ACL: Normal 10. PCL: Normal 11. Medial meniscus: Complex tear with  small radial tears of the  posterior horn and body with extension along the meniscus undersurface affecting approximately 50% of the meniscus width 12. Medial compartment cartilage: Focal area of grade 2-3 degenerative changes to the medial femoral condyle measuring approximately 8 x 8 mm.  Diffuse grade 1 degenerative changes the tibial plateau and tibial plateau 13. Lateral meniscus: Normal 14. Lateral compartment cartilage: Grade 1-2 degenerative changes to the tibial plateau.  Normal lateral femoral condyle   OPERATIVE REPORT:     I identified Eric Henderson in the pre-operative holding area. I marked the operative knee with my initials. I reviewed the risks and benefits of the proposed surgical intervention and the patient (and/or patient's guardian) wished to proceed. The patient was transferred to the operative suite and placed in the supine position with all bony prominences padded.  Anesthesia was administered. Appropriate IV antibiotics were administered prior to incision. The extremity was then prepped and draped in standard fashion. A time out was performed confirming the correct extremity, correct patient, and correct procedure.   Arthroscopy portals were marked. Local anesthetic was injected to the planned portal sites. The anterolateral portal was established with an 11 blade.      The arthroscope was placed in the anterolateral portal and then into the suprapatellar pouch.  A diagnostic knee scope was completed with the above findings. The medial meniscus tear was identified.   Next the medial portal was established under needle localization. The MCL was pie-crusted to improve visualization of the posterior horn. The meniscal tear was debrided using an arthroscopic biter and an oscillating shaver until the meniscus had stable borders.  After debridement, approximately 75% of the  posterior horn width remained.  A chondroplasty was performed of the medial compartment and patellofemoral compartment such that there  were stable cartilage edges without any loose fragments of cartilage. Arthroscopic fluid was removed from the joint.   The portals were closed with 3-0 Nylon suture. Sterile dressings included Xeroform, 4x4s, Sof-Rol, and Bias wrap. A Polarcare was placed.  The patient was then awakened and taken to the PACU hemodynamically stable without complication.     POSTOPERATIVE PLAN: The patient will be discharged home today once they meet PACU criteria. Aspirin 325 mg daily was prescribed for 2 weeks for DVT prophylaxis.  Physical therapy will start on POD#3-4. Weight-bearing as tolerated. Follow up in 2 weeks per protocol.

## 2019-08-29 NOTE — Anesthesia Preprocedure Evaluation (Signed)
Anesthesia Evaluation  Patient identified by MRN, date of birth, ID band Patient awake    History of Anesthesia Complications Negative for: history of anesthetic complications  Airway Mallampati: III  TM Distance: >3 FB Neck ROM: Full    Dental no notable dental hx.    Pulmonary neg pulmonary ROS,    Pulmonary exam normal        Cardiovascular Exercise Tolerance: Good hypertension, (-) anginaNormal cardiovascular exam     Neuro/Psych negative neurological ROS  negative psych ROS   GI/Hepatic GERD  Controlled and Medicated,(+)     substance abuse (~5 alcoholic beverages per day)  ,   Endo/Other  Morbid obesity  Renal/GU negative Renal ROS     Musculoskeletal negative musculoskeletal ROS (+)   Abdominal (+) + obese,   Peds  Hematology negative hematology ROS (+)   Anesthesia Other Findings   Reproductive/Obstetrics                             Anesthesia Physical Anesthesia Plan  ASA: II  Anesthesia Plan: General   Post-op Pain Management:    Induction:   PONV Risk Score and Plan: 2 and Midazolam, Ondansetron and Treatment may vary due to age or medical condition  Airway Management Planned: LMA  Additional Equipment: None  Intra-op Plan:   Post-operative Plan: Extubation in OR  Informed Consent: I have reviewed the patients History and Physical, chart, labs and discussed the procedure including the risks, benefits and alternatives for the proposed anesthesia with the patient or authorized representative who has indicated his/her understanding and acceptance.       Plan Discussed with: CRNA  Anesthesia Plan Comments:         Anesthesia Quick Evaluation

## 2019-08-29 NOTE — Anesthesia Procedure Notes (Signed)
Procedure Name: LMA Insertion Date/Time: 08/29/2019 12:44 PM Performed by: Mayme Genta, CRNA Pre-anesthesia Checklist: Patient identified, Emergency Drugs available, Suction available, Timeout performed and Patient being monitored Patient Re-evaluated:Patient Re-evaluated prior to induction Oxygen Delivery Method: Circle system utilized Preoxygenation: Pre-oxygenation with 100% oxygen Induction Type: IV induction LMA: LMA inserted LMA Size: 4.0 Number of attempts: 1 Placement Confirmation: positive ETCO2 and breath sounds checked- equal and bilateral Tube secured with: Tape

## 2019-08-29 NOTE — Anesthesia Postprocedure Evaluation (Signed)
Anesthesia Post Note  Patient: Eric Henderson  Procedure(s) Performed: LEFT KNEE ARTHROSCOPIC PARTIAL MEDIAL MENISCECTOMY (Left Knee)     Patient location during evaluation: PACU Anesthesia Type: General Level of consciousness: awake and alert Pain management: pain level controlled Vital Signs Assessment: post-procedure vital signs reviewed and stable Respiratory status: spontaneous breathing, nonlabored ventilation, respiratory function stable and patient connected to nasal cannula oxygen Cardiovascular status: blood pressure returned to baseline and stable Postop Assessment: no apparent nausea or vomiting Anesthetic complications: no    Adele Barthel Britny Riel

## 2019-08-29 NOTE — Transfer of Care (Signed)
Immediate Anesthesia Transfer of Care Note  Patient: Eric Henderson  Procedure(s) Performed: LEFT KNEE ARTHROSCOPIC PARTIAL MEDIAL MENISCECTOMY (Left Knee)  Patient Location: PACU  Anesthesia Type: General  Level of Consciousness: awake, alert  and patient cooperative  Airway and Oxygen Therapy: Patient Spontanous Breathing and Patient connected to supplemental oxygen  Post-op Assessment: Post-op Vital signs reviewed, Patient's Cardiovascular Status Stable, Respiratory Function Stable, Patent Airway and No signs of Nausea or vomiting  Post-op Vital Signs: Reviewed and stable  Complications: No apparent anesthesia complications

## 2019-08-29 NOTE — Discharge Instructions (Signed)
Arthroscopic Knee Surgery - Partial Meniscectomy   Post-Op Instructions   1. Bracing or crutches: Crutches will be provided at the time of discharge from the surgery center if you do not already have them.   2. Ice: You may be provided with a device [facility]) that allows you to ice the affected area effectively. Otherwise you can ice manually.    3. Driving:  Plan on not driving for at least two weeks. Please note that you are advised NOT to drive while taking narcotic pain medications as you may be impaired and unsafe to drive.   4. Activity: Ankle pumps several times an hour while awake to prevent blood clots. Weight bearing: as tolerated. Use crutches for as needed (usually ~1 week or less) until pain allows you to ambulate without a limp. Bending and straightening the knee is unlimited. Elevate knee above heart level as much as possible for one week. Avoid standing more than 5 minutes (consecutively) for the first week.  Avoid long distance travel for 2 weeks.  5. Medications:  - You have been provided a prescription for narcotic pain medicine. After surgery, take 1-2 narcotic tablets every 4 hours if needed for severe pain.  - You may take up to 3000mg /day of tylenol (acetaminophen). You can take 1000mg  3x/day. Please check your narcotic. If you have acetaminophen in your narcotic (each tablet will be 325mg ), be careful not to exceed a total of 3000mg /day of acetaminophen.  - A prescription for anti-nausea medication will be provided in case the narcotic medicine or anesthesia causes nausea - take 1 tablet every 6 hours only if nauseated.  - Take ibuprofen 800 mg every 8 hours WITH food to reduce post-operative knee swelling. DO NOT STOP IBUPROFEN POST-OP UNTIL INSTRUCTED TO DO SO at first post-op office visit (10-14 days after surgery). However, please discontinue if you have any abdominal discomfort after taking this.  - Take enteric coated aspirin 325 mg once daily for 2 weeks to prevent  blood clots.    6. Bandages: The physical therapist should change the bandages at the first post-op appointment. If needed, the dressing supplies have been provided to you.   7. Physical Therapy: 1-2 times per week for 6 weeks. Therapy typically starts on post operative Day 3 or 4. You have been provided an order for physical therapy. The therapist will provide home exercises.   8. Work: May return to full work usually around 2 weeks after 1st post-operative visit. May do light duty/desk job in approximately 1-2 weeks when off of narcotics, pain is well-controlled, and swelling has decreased. Labor intensive jobs may require 4-6 weeks to return.      9. Post-Op Appointments: Your first post-op appointment will be with Dr. Posey Pronto in approximately 2 weeks time.    If you find that they have not been scheduled please call the Orthopaedic Appointment front desk at [phone].    General Anesthesia, Adult, Care After This sheet gives you information about how to care for yourself after your procedure. Your health care provider may also give you more specific instructions. If you have problems or questions, contact your health care provider. What can I expect after the procedure? After the procedure, the following side effects are common:  Pain or discomfort at the IV site.  Nausea.  Vomiting.  Sore throat.  Trouble concentrating.  Feeling cold or chills.  Weak or tired.  Sleepiness and fatigue.  Soreness and body aches. These side effects can affect parts of  the body that were not involved in surgery. Follow these instructions at home:  For at least 24 hours after the procedure:  Have a responsible adult stay with you. It is important to have someone help care for you until you are awake and alert.  Rest as needed.  Do not: ? Participate in activities in which you could fall or become injured. ? Drive. ? Use heavy machinery. ? Drink alcohol. ? Take sleeping pills or  medicines that cause drowsiness. ? Make important decisions or sign legal documents. ? Take care of children on your own. Eating and drinking  Follow any instructions from your health care provider about eating or drinking restrictions.  When you feel hungry, start by eating small amounts of foods that are soft and easy to digest (bland), such as toast. Gradually return to your regular diet.  Drink enough fluid to keep your urine pale yellow.  If you vomit, rehydrate by drinking water, juice, or clear broth. General instructions  If you have sleep apnea, surgery and certain medicines can increase your risk for breathing problems. Follow instructions from your health care provider about wearing your sleep device: ? Anytime you are sleeping, including during daytime naps. ? While taking prescription pain medicines, sleeping medicines, or medicines that make you drowsy.  Return to your normal activities as told by your health care provider. Ask your health care provider what activities are safe for you.  Take over-the-counter and prescription medicines only as told by your health care provider.  If you smoke, do not smoke without supervision.  Keep all follow-up visits as told by your health care provider. This is important. Contact a health care provider if:  You have nausea or vomiting that does not get better with medicine.  You cannot eat or drink without vomiting.  You have pain that does not get better with medicine.  You are unable to pass urine.  You develop a skin rash.  You have a fever.  You have redness around your IV site that gets worse. Get help right away if:  You have difficulty breathing.  You have chest pain.  You have blood in your urine or stool, or you vomit blood. Summary  After the procedure, it is common to have a sore throat or nausea. It is also common to feel tired.  Have a responsible adult stay with you for the first 24 hours after general  anesthesia. It is important to have someone help care for you until you are awake and alert.  When you feel hungry, start by eating small amounts of foods that are soft and easy to digest (bland), such as toast. Gradually return to your regular diet.  Drink enough fluid to keep your urine pale yellow.  Return to your normal activities as told by your health care provider. Ask your health care provider what activities are safe for you. This information is not intended to replace advice given to you by your health care provider. Make sure you discuss any questions you have with your health care provider. Document Released: 11/13/2000 Document Revised: 08/10/2017 Document Reviewed: 03/23/2017 Elsevier Patient Education  2020 [employer].

## 2019-08-29 NOTE — H&P (Signed)
Paper H&P to be scanned into permanent record. H&P reviewed. No significant changes noted.  

## 2019-09-22 ENCOUNTER — Encounter: Payer: Self-pay | Admitting: Hematology and Oncology

## 2019-09-22 ENCOUNTER — Other Ambulatory Visit: Payer: Self-pay

## 2019-09-22 NOTE — Progress Notes (Signed)
No new changes noted today. The patient name and DOB has been verified by phone today. 

## 2019-09-22 NOTE — Progress Notes (Signed)
[facility]  [address] [place],  [place] [address] Phone: [phone]  Fax: [phone]   Clinic Day:  09/23/2019  Referring physician: Ezequiel Kayser, MD  Chief Complaint: Eric Henderson is a 69 y.o. male with IgA monoclonal gammopathy who is seen for a 11 month assessment.  HPI: The patient was last seen in the medical oncology clinic on 11/04/2018 as a new patient. At that time, he felt "worn out" secondary to his job.  He denied any B symptoms or infections.  He denied any bone pain.  Exam was unremarkable. CBC revealed a hematocrit 38.2, hemoglobin 12.6, platelets 233,000, WBC 5,00. Calcium was 8.6. IgA was 526. M spike was 0.3 gm/dL. Kappa free light chains were 23.4, lambda free light chains 28.0, and ratio 0.84 (normal).  Patient agreed to a 24 hour UPEP with free light chains and a bone survey.   The patient had an initial consult with Vance Peper, PA in orthopaedics on 06/23/2019 for his chronic bilateral knee pain.  He was started on meloxicam.  Left knee MRI on 07/03/2019 showed meniscocapsular injury vs far peripheral longitudinal tear involving the posterior horn of the medial meniscus. There were also small free edge tears involving the mid body region. There was intact ligamentous structures and no acute bony findings. There was no joint effusion. There was a small Baker's cyst and mild medial compartment degenerative chondrosis.  Patient underwent left knee arthroscopy, partial medial meniscectomy and a chondroplasty of patellofemora and medial compartments on 08/29/2019 by Dr. Posey Pronto .  He is being seen by Durward Mallard, PT at [facility] s/p left knee surgery.   During the interim, the patient felt "alright". His left knee feels better. He denies any infections. 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 He denies bleeding of any kind. He denies any adenopathy. He notes constant ringing in his ears related to hunting.    Patient is not taking any calcium pills.    Past Medical History:  Diagnosis Date  . Dental bridge present    upper  . GERD (gastroesophageal reflux disease)   . Gout   . Hypercholesteremia   . Hypertension   . Motion sickness    swings    Past Surgical History:  Procedure Laterality Date  . COLONOSCOPY WITH ESOPHAGOGASTRODUODENOSCOPY (EGD)  09/28/2016  . KNEE ARTHROSCOPY WITH MEDIAL MENISECTOMY Left 08/29/2019   Procedure: LEFT KNEE ARTHROSCOPIC PARTIAL MEDIAL MENISCECTOMY;  Surgeon: Leim Fabry, MD;  Location: [facility];  Service: Orthopedics;  Laterality: Left;    History reviewed. No pertinent family history.  Social History:  reports that he has never smoked. He has never used smokeless tobacco. He reports current alcohol use of about 70.0 standard drinks of alcohol per week. He reports that he does not use drugs. He drinks 4-5 Vodka and tonic per night and a smattering of beer.  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  He works with a buddy on Music therapist, Social research officer, government. He works variable hours depending on the job.  He lives in [place]. The patient is alone today.  Allergies: No Known Allergies  Current Medications: Current Outpatient Medications  Medication Sig Dispense Refill  . acetaminophen (TYLENOL) 500 MG tablet Take 2 tablets (1,000 mg total) by mouth every 8 (eight) hours. 90 tablet 2  . HYDROcodone-acetaminophen (NORCO) 5-325 MG tablet Take 1-2 tablets by mouth every 4 (four) hours as needed for moderate pain or severe pain.  10 tablet 0  . lisinopril-hydrochlorothiazide (PRINZIDE,ZESTORETIC) 10-12.5 MG tablet TK 1 T PO QD  1  . lovastatin (MEVACOR) 40 MG tablet Take 40 mg by mouth at bedtime.   1  . metoprolol succinate (TOPROL-XL) 25 MG 24 hr tablet Take 25 mg by mouth daily.     . Omega-3 Fatty Acids (FISH OIL) 1000 MG CAPS Take 1 capsule by mouth daily.     Marland Kitchen omeprazole (PRILOSEC) 40 MG capsule TAKE 1 CAPSULE (40 MG TOTAL) BY MOUTH ONCE  DAILY PATIENT NEEDS AN APPOINTMENT FOR FURTHER REFILLS.  0  . vitamin B-12 (CYANOCOBALAMIN) 1000 MCG tablet Take 1,000 mcg by mouth daily.     . ondansetron (ZOFRAN ODT) 4 MG disintegrating tablet Take 1 tablet (4 mg total) by mouth every 8 (eight) hours as needed for nausea or vomiting. (Patient not taking: Reported on 09/22/2019) 20 tablet 0   No current facility-administered medications for this visit.    Review of Systems  Constitutional: Negative for chills, diaphoresis, fever, malaise/fatigue and weight loss.       Feels "alright".  HENT: Positive for tinnitus (costant). Negative for congestion, ear pain, hearing loss, nosebleeds and sore throat.   Eyes: Negative.  Negative for blurred vision and double vision.  Respiratory: Negative.  Negative for cough, hemoptysis, sputum production and shortness of breath.   Cardiovascular: Negative.  Negative for chest pain, palpitations and leg swelling.  Gastrointestinal: Negative.  Negative for abdominal pain, blood in stool, constipation, diarrhea, heartburn, melena, nausea and vomiting.  Genitourinary: Negative.  Negative for dysuria, frequency, hematuria and urgency.  Musculoskeletal: Negative.  Negative for joint pain (left knee; better), myalgias and neck pain.  Skin: Negative.  Negative for itching and rash.  Neurological: Negative.  Negative for dizziness, tingling, sensory change, weakness and headaches.  Endo/Heme/Allergies: Negative.  Does not bruise/bleed easily.  Psychiatric/Behavioral: Negative.  Negative for depression and memory loss. The patient is not nervous/anxious and does not have insomnia.   All other systems reviewed and are negative.  Performance status (ECOG): 0-1  Vitals Blood pressure 131/90, pulse 68, temperature (!) 96.9 F (36.1 C), temperature source Tympanic, resp. rate 18, height 5\' 7"  (1.702 m), weight 235 lb 5.5 oz (106.8 kg), SpO2 98 %.   Physical Exam  Constitutional: He is oriented to person, place, and  time. He appears well-developed and well-nourished. No distress.  HENT:  Head: Normocephalic and atraumatic.  Mouth/Throat: Oropharynx is clear and moist. No oropharyngeal exudate.  Gray hair and [employer].  Eyes: Pupils are equal, round, and reactive to light. Conjunctivae and EOM are normal. No scleral icterus.  Blue eyes.  Neck: No JVD present.  Cardiovascular: Normal rate, regular rhythm and normal heart sounds.  No murmur heard. Pulmonary/Chest: Effort normal and breath sounds normal. No respiratory distress. He has no wheezes. He has no rales. He exhibits no tenderness.  Abdominal: Soft. Bowel sounds are normal. He exhibits no distension and no mass. There is no abdominal tenderness. There is no rebound and no guarding.  Musculoskeletal:        General: No tenderness or edema. Normal range of motion.     Cervical back: Normal range of motion and neck supple.  Lymphadenopathy:       Head (right side): No preauricular, no posterior auricular and no occipital adenopathy present.       Head (left side): No preauricular, no posterior auricular and no occipital adenopathy present.    He has no cervical adenopathy.    He has  no axillary adenopathy.       Right: No inguinal and no supraclavicular adenopathy present.       Left: No inguinal and no supraclavicular adenopathy present.  Neurological: He is alert and oriented to person, place, and time.  Skin: Skin is warm and dry. No rash noted. He is not diaphoretic. No erythema. No pallor.  Psychiatric: He has a normal mood and affect. His behavior is normal. Judgment and thought content normal.  Nursing note and vitals reviewed.   Appointment on 09/23/2019  Component Date Value Ref Range Status  . LDH 09/23/2019 120  98 - 192 U/L Final   Performed at [facility], [address], [place], [place] [address]  . Sodium 09/23/2019 136  135 - 145 mmol/L Final  . Potassium 09/23/2019 3.6  3.5 - 5.1 mmol/L Final  . Chloride  09/23/2019 103  98 - 111 mmol/L Final  . CO2 09/23/2019 29  22 - 32 mmol/L Final  . Glucose, Bld 09/23/2019 135* 70 - 99 mg/dL Final  . BUN 09/23/2019 15  8 - 23 mg/dL Final  . Creatinine, Ser 09/23/2019 1.03  0.61 - 1.24 mg/dL Final  . Calcium 09/23/2019 8.7* 8.9 - 10.3 mg/dL Final  . Total Protein 09/23/2019 7.3  6.5 - 8.1 g/dL Final  . Albumin 09/23/2019 3.9  3.5 - 5.0 g/dL Final  . AST 09/23/2019 28  15 - 41 U/L Final  . ALT 09/23/2019 32  0 - 44 U/L Final  . Alkaline Phosphatase 09/23/2019 62  38 - 126 U/L Final  . Total Bilirubin 09/23/2019 0.6  0.3 - 1.2 mg/dL Final  . GFR calc non Af Amer 09/23/2019 >60  >60 mL/min Final  . GFR calc Af Amer 09/23/2019 >60  >60 mL/min Final  . Anion gap 09/23/2019 4* 5 - 15 Final   Performed at [facility], [address], [place], [place] [address]  . WBC 09/23/2019 5.5  4.0 - 10.5 K/uL Final  . RBC 09/23/2019 4.13* 4.22 - 5.81 MIL/uL Final  . Hemoglobin 09/23/2019 13.4  13.0 - 17.0 g/dL Final  . HCT 09/23/2019 39.7  39.0 - 52.0 % Final  . MCV 09/23/2019 96.1  80.0 - 100.0 fL Final  . MCH 09/23/2019 32.4  26.0 - 34.0 pg Final  . MCHC 09/23/2019 33.8  30.0 - 36.0 g/dL Final  . RDW 09/23/2019 12.5  11.5 - 15.5 % Final  . Platelets 09/23/2019 244  150 - 400 K/uL Final  . nRBC 09/23/2019 0.0  0.0 - 0.2 % Final  . Neutrophils Relative % 09/23/2019 41  % Final  . Neutro Abs 09/23/2019 2.2  1.7 - 7.7 K/uL Final  . Lymphocytes Relative 09/23/2019 45  % Final  . Lymphs Abs 09/23/2019 2.5  0.7 - 4.0 K/uL Final  . Monocytes Relative 09/23/2019 11  % Final  . Monocytes Absolute 09/23/2019 0.6  0.1 - 1.0 K/uL Final  . Eosinophils Relative 09/23/2019 2  % Final  . Eosinophils Absolute 09/23/2019 0.1  0.0 - 0.5 K/uL Final  . Basophils Relative 09/23/2019 1  % Final  . Basophils Absolute 09/23/2019 0.0  0.0 - 0.1 K/uL Final  . Immature Granulocytes 09/23/2019 0  % Final  . Abs Immature Granulocytes 09/23/2019 0.01  0.00 - 0.07 K/uL Final    Performed at [facility], [address] [place], [place] [address]    Assessment:  Eric Henderson is a 69 y.o. 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  Labs on 10/29/2018 included a hematocrit of 38.2, hemoglobin 12.6, MCV 99.0, platelets 233,000, WBC 5000 with an ANC of 1800.  Creatinine and calcium were normal.    SPEP has been followed (gm/dL):  0.2 on 04/05/2018, 0.3 on 10/29/2018, and 0.3 on 09/23/2019.  IgA was 526 (61-437) on 10/29/2018 and 561 on 09/23/2019.  Lambda free light chains were 28.0 (ratio 0.84) on 10/29/2018 and 28.5 (ratio 0.71 on 09/23/2019).  Symptomatically, he is doing well.  Exam is stable.  Plan: 1.   Labs today:  CBC with diff, CMP, myeloma panel, FLCA, beta 2-microglobin, LDH. 2.   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    M-spike is stable at 0.3 gm/dL.  Beta-2 microglobulin is 1.7 and LDH 120.             There is no evidence of endorgan damage (CRAB criteria).             Suspect monoclonal gammopathy of unknown significance (MGUS).  Discuss ongoing surveillance. 3.   Collect 24-hour urine for UPEP and free light chains. 4.   Bone survey. 5.   RTC in 6 months for MD assessment and labs (CBC with differential, CMP, SPEP, FLCA).   Addendum: Bone survey on 09/23/2019 revealed osteopenia versus artifact in the lumbar spine, ribs, and pelvis.  There was no discrete lytic or destructive osseous lesion.  There was mild calcified aortic sclerosis atherosclerosis.  I discussed the assessment and treatment plan with the patient.  The patient was provided an opportunity to ask questions and all were answered.  The patient agreed with the plan and demonstrated an understanding of the instructions.  The patient was advised to call back if the symptoms worsen or if the condition fails to improve as  anticipated.   Lequita Asal, MD, PhD    09/23/2019, 10:29 AM  I, Selena Batten, am acting as scribe for [employer]. Mike Gip, MD, PhD.  I, Mattye Verdone C. Mike Gip, MD, have reviewed the above documentation for accuracy and completeness, and I agree with the above.

## 2019-09-23 ENCOUNTER — Inpatient Hospital Stay: Payer: Medicare HMO

## 2019-09-23 ENCOUNTER — Inpatient Hospital Stay: Payer: Medicare HMO | Attending: Hematology and Oncology | Admitting: Hematology and Oncology

## 2019-09-23 ENCOUNTER — Encounter: Payer: Self-pay | Admitting: Hematology and Oncology

## 2019-09-23 ENCOUNTER — Ambulatory Visit
Admission: RE | Admit: 2019-09-23 | Discharge: 2019-09-23 | Disposition: A | Discharge status: Home / Self Care | Payer: Medicare HMO | Attending: Hematology and Oncology | Admitting: Hematology and Oncology

## 2019-09-23 ENCOUNTER — Ambulatory Visit
Admission: RE | Admit: 2019-09-23 | Discharge: 2019-09-23 | Disposition: A | Discharge status: Home / Self Care | Payer: Medicare HMO | Source: Ambulatory Visit | Attending: Hematology and Oncology | Admitting: Hematology and Oncology

## 2019-09-23 VITALS — BP 131/90 | HR 68 | Temp 96.9°F | Resp 18 | Ht 67.0 in | Wt 235.3 lb

## 2019-09-23 DIAGNOSIS — D649 Anemia, unspecified: Secondary | ICD-10-CM | POA: Insufficient documentation

## 2019-09-23 DIAGNOSIS — D472 Monoclonal gammopathy: Secondary | ICD-10-CM

## 2019-09-23 DIAGNOSIS — M8580 Other specified disorders of bone density and structure, unspecified site: Secondary | ICD-10-CM | POA: Insufficient documentation

## 2019-09-23 LAB — COMPREHENSIVE METABOLIC PANEL
ALT: 32 U/L (ref 0–44)
AST: 28 U/L (ref 15–41)
Albumin: 3.9 g/dL (ref 3.5–5.0)
Alkaline Phosphatase: 62 U/L (ref 38–126)
Anion gap: 4 — ABNORMAL LOW (ref 5–15)
BUN: 15 mg/dL (ref 8–23)
CO2: 29 mmol/L (ref 22–32)
Calcium: 8.7 mg/dL — ABNORMAL LOW (ref 8.9–10.3)
Chloride: 103 mmol/L (ref 98–111)
Creatinine, Ser: 1.03 mg/dL (ref 0.61–1.24)
GFR calc Af Amer: >60 mL/min (ref >60)
GFR calc non Af Amer: >60 mL/min (ref >60)
Glucose, Bld: 135 mg/dL — ABNORMAL HIGH (ref 70–99)
Potassium: 3.6 mmol/L (ref 3.5–5.1)
Sodium: 136 mmol/L (ref 135–145)
Total Bilirubin: 0.6 mg/dL (ref 0.3–1.2)
Total Protein: 7.3 g/dL (ref 6.5–8.1)

## 2019-09-23 LAB — CBC WITH DIFFERENTIAL/PLATELET
Abs Immature Granulocytes: 0.01 10*3/uL (ref 0.00–0.07)
Basophils Absolute: 0 10*3/uL (ref 0.0–0.1)
Basophils Relative: 1 %
Eosinophils Absolute: 0.1 10*3/uL (ref 0.0–0.5)
Eosinophils Relative: 2 %
HCT: 39.7 % (ref 39.0–52.0)
Hemoglobin: 13.4 g/dL (ref 13.0–17.0)
Immature Granulocytes: 0 %
Lymphocytes Relative: 45 %
Lymphs Abs: 2.5 10*3/uL (ref 0.7–4.0)
MCH: 32.4 pg (ref 26.0–34.0)
MCHC: 33.8 g/dL (ref 30.0–36.0)
MCV: 96.1 fL (ref 80.0–100.0)
Monocytes Absolute: 0.6 10*3/uL (ref 0.1–1.0)
Monocytes Relative: 11 %
Neutro Abs: 2.2 10*3/uL (ref 1.7–7.7)
Neutrophils Relative %: 41 %
Platelets: 244 10*3/uL (ref 150–400)
RBC: 4.13 MIL/uL — ABNORMAL LOW (ref 4.22–5.81)
RDW: 12.5 % (ref 11.5–15.5)
WBC: 5.5 10*3/uL (ref 4.0–10.5)
nRBC: 0 % (ref 0.0–0.2)

## 2019-09-23 LAB — LACTATE DEHYDROGENASE: LDH: 120 U/L (ref 98–192)

## 2019-09-23 NOTE — Patient Instructions (Signed)
Monoclonal Gammopathy of Undetermined Significance (MGUS) Monoclonal gammopathy of undetermined significance (MGUS) is a condition in which there is too much of a protein called monoclonal protein, or M protein, in the blood. MGUS can cause you to have too many cells in your blood and not enough space for healthy cells. This condition may increase your risk of developing multiple myeloma or other blood disorders in the future. What are the causes? The cause of this condition is not known. What increases the risk? You are more likely to develop this condition if:  You are African American.  You are age 60 or older.  You are male.  You have an autoimmune disease.  You have been exposed to radiation.  You have a family history of MGUS. What are the signs or symptoms? There are no symptoms of this condition. How is this diagnosed?  This condition may be diagnosed with a blood test that checks for M protein. How is this treated? Treatment for this condition may involve:  Having regular exams. This will allow your health care provider to monitor your health.  Having tests done regularly, such as: ? Blood tests to check for M protein in your body. ? Imaging tests, such as a CT scan. ? A bone marrow biopsy. This test involves taking a sample of bone marrow from your body so it can be looked at under a microscope. Follow these instructions at home:  Keep all follow-up visits as told by your health care provider. This is important. Contact a health care provider if:  You have trouble swallowing.  You have pain in your back or ribs.  You have a fever.  You are bruising easily. Get help right away if:  You break a bone.  You have trouble breathing. Summary  Monoclonal gammopathy of undetermined significance (MGUS) is a condition in which there is too much of a protein called monoclonal protein, or M protein, in the blood.  This condition may be diagnosed with a blood test  that checks for M protein.  Treatment for this condition may involve having tests done regularly. Tests may include blood tests, imaging tests, and a bone marrow biopsy. This information is not intended to replace advice given to you by your health care provider. Make sure you discuss any questions you have with your health care provider. Document Revised: 09/30/2018 Document Reviewed: 06/28/2016 Elsevier Patient Education  2020 [employer].

## 2019-09-24 LAB — KAPPA/LAMBDA LIGHT CHAINS
Kappa free light chain: 20.2 mg/L — ABNORMAL HIGH (ref 3.3–19.4)
Kappa, lambda light chain ratio: 0.71 (ref 0.26–1.65)
Lambda free light chains: 28.5 mg/L — ABNORMAL HIGH (ref 5.7–26.3)

## 2019-09-24 LAB — BETA 2 MICROGLOBULIN, SERUM: Beta-2 Microglobulin: 1.7 mg/L (ref 0.6–2.4)

## 2019-09-25 LAB — MULTIPLE MYELOMA PANEL, SERUM
Albumin SerPl Elph-Mcnc: 3.7 g/dL (ref 2.9–4.4)
Albumin/Glob SerPl: 1.3 (ref 0.7–1.7)
Alpha 1: 0.2 g/dL (ref 0.0–0.4)
Alpha2 Glob SerPl Elph-Mcnc: 0.6 g/dL (ref 0.4–1.0)
B-Globulin SerPl Elph-Mcnc: 1.4 g/dL — ABNORMAL HIGH (ref 0.7–1.3)
Gamma Glob SerPl Elph-Mcnc: 0.7 g/dL (ref 0.4–1.8)
Globulin, Total: 2.9 g/dL (ref 2.2–3.9)
IgA: 561 mg/dL — ABNORMAL HIGH (ref 61–437)
IgG (Immunoglobin G), Serum: 946 mg/dL (ref 603–1613)
IgM (Immunoglobulin M), Srm: 184 mg/dL — ABNORMAL HIGH (ref 20–172)
M Protein SerPl Elph-Mcnc: 0.3 g/dL — ABNORMAL HIGH
Total Protein ELP: 6.6 g/dL (ref 6.0–8.5)

## 2019-10-23 ENCOUNTER — Other Ambulatory Visit: Payer: Self-pay

## 2019-10-23 DIAGNOSIS — D472 Monoclonal gammopathy: Secondary | ICD-10-CM

## 2019-10-25 LAB — UPEP/UIFE/LIGHT CHAINS/TP, 24-HR UR
% BETA, Urine: 0 %
ALPHA 1 URINE: 0 %
Albumin, U: 0 %
Alpha 2, Urine: 0 %
Free Kappa Lt Chains,Ur: 7.07 mg/L (ref 0.63–113.79)
Free Kappa/Lambda Ratio: 7.94 (ref 1.03–31.76)
Free Lambda Lt Chains,Ur: 0.89 mg/L (ref 0.47–11.77)
GAMMA GLOBULIN URINE: 0 %
Total Protein, Urine-Ur/day: 74 mg/24 hr (ref 30–150)
Total Protein, Urine: 4 mg/dL
Total Volume: 1850

## 2020-03-21 NOTE — Progress Notes (Signed)
[facility]  [address] [place], [place] [address] Phone: [phone]  Fax: [phone]   Clinic Day:  03/22/2020  Referring physician: Ezequiel Kayser, MD  Chief Complaint: Eric Henderson is a 69 y.o. male with IgA monoclonal gammopathy who is seen for 6 month assessment.  HPI: The patient was last seen in the medical oncology clinic on 09/23/2019. At that time, he was doing well.  Exam was stable. Hematocrit was 39.7, hemoglobin 13.4, platelets 224,000, WBC 5,500. Calcium was 8.7.  LDH was 120. Beta-2 microglobulin was 1.7. M spike was 0.3 g/dL. Kappa free light chains were 20.2, lambda free light chains 28.5, and ratio 0.71. We discussed ongoing surveillance.  24-hour UPEP revealed no monoclonal protein on 10/22/2019.    Bone survey on 09/23/2019 revealed osteopenia versus artifact in the lumbar spine, ribs and pelvis. There was no discrete lytic or destructive osseous lesion. There was mild calcified aortic atherosclerosis.  During the interim, he has been good. He still has constant ringing in his ears. His left knee is the same. His appetite is good. Denies fevers, chills, nausea, vomiting, and diarrhea. He had some right eye droopiness.    Past Medical History:  Diagnosis Date  . Dental bridge present    upper  . GERD (gastroesophageal reflux disease)   . Gout   . Hypercholesteremia   . Hypertension   . Motion sickness    swings    Past Surgical History:  Procedure Laterality Date  . COLONOSCOPY WITH ESOPHAGOGASTRODUODENOSCOPY (EGD)  09/28/2016  . KNEE ARTHROSCOPY WITH MEDIAL MENISECTOMY Left 08/29/2019   Procedure: LEFT KNEE ARTHROSCOPIC PARTIAL MEDIAL MENISCECTOMY;  Surgeon: Leim Fabry, MD;  Location: [place];  Service: Orthopedics;  Laterality: Left;    History reviewed. No pertinent family history.  Social History:  reports that he has never smoked. He has never used smokeless tobacco. He reports current alcohol use  of about 70.0 standard drinks of alcohol per week. He reports that he does not use drugs. He drinks 4-5 Vodka and tonic per night and a smattering of beer.  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  He works with a buddy on Music therapist, Social research officer, government. He works variable hours depending on the job.  He lives in [place]. The patient is alone today.  Allergies: No Known Allergies  Current Medications: Current Outpatient Medications  Medication Sig Dispense Refill  . acetaminophen (TYLENOL) 500 MG tablet Take 2 tablets (1,000 mg total) by mouth every 8 (eight) hours. 90 tablet 2  . HYDROcodone-acetaminophen (NORCO) 5-325 MG tablet Take 1-2 tablets by mouth every 4 (four) hours as needed for moderate pain or severe pain. 10 tablet 0  . lisinopril-hydrochlorothiazide (PRINZIDE,ZESTORETIC) 10-12.5 MG tablet TK 1 T PO QD  1  . lovastatin (MEVACOR) 40 MG tablet Take 40 mg by mouth at bedtime.   1  . metoprolol succinate (TOPROL-XL) 25 MG 24 hr tablet Take 25 mg by mouth daily.     . Omega-3 Fatty Acids (FISH OIL) 1000 MG CAPS Take 1 capsule by mouth daily.     Marland Kitchen omeprazole (PRILOSEC) 40 MG capsule TAKE 1 CAPSULE (40 MG TOTAL) BY MOUTH ONCE DAILY PATIENT NEEDS AN APPOINTMENT FOR FURTHER REFILLS.  0  . vitamin B-12 (CYANOCOBALAMIN) 1000 MCG tablet Take 1,000 mcg by mouth daily.     . ondansetron (ZOFRAN ODT) 4 MG disintegrating tablet Take 1 tablet (4 mg total) by mouth every 8 (eight) hours as needed  for nausea or vomiting. (Patient not taking: Reported on 09/22/2019) 20 tablet 0   No current facility-administered medications for this visit.    Review of Systems  Constitutional: Positive for weight loss (11 lb). Negative for chills, diaphoresis, fever and malaise/fatigue.       Feels "alright".  HENT: Positive for tinnitus (constant). Negative for congestion, ear pain, hearing loss, nosebleeds and sore throat.        Right eyelid droop.  Eyes: Negative.  Negative for blurred vision and double  vision.  Respiratory: Negative.  Negative for cough, hemoptysis, sputum production and shortness of breath.   Cardiovascular: Negative.  Negative for chest pain, palpitations and leg swelling.  Gastrointestinal: Negative.  Negative for abdominal pain, blood in stool, constipation, diarrhea, heartburn, melena, nausea and vomiting.       Good appetite.  Genitourinary: Negative.  Negative for dysuria, frequency, hematuria and urgency.  Musculoskeletal: Negative.  Negative for back pain, joint pain, myalgias and neck pain.  Skin: Negative.  Negative for itching and rash.  Neurological: Negative.  Negative for dizziness, tingling, sensory change, weakness and headaches.  Endo/Heme/Allergies: Negative.  Does not bruise/bleed easily.  Psychiatric/Behavioral: Negative.  Negative for depression and memory loss. The patient is not nervous/anxious and does not have insomnia.   All other systems reviewed and are negative.  Performance status (ECOG): 1  Vitals Blood pressure 113/75, pulse 52, temperature (!) 97.4 F (36.3 C), temperature source Tympanic, weight (!) 224 lb 8.6 oz (101.9 kg), SpO2 100 %.   Physical Exam Vitals and nursing note reviewed.  Constitutional:      General: He is not in acute distress.    Appearance: He is well-developed. He is not diaphoretic.  HENT:     Head: Normocephalic and atraumatic.     Mouth/Throat:     Pharynx: No oropharyngeal exudate.  Eyes:     General: No scleral icterus.    Extraocular Movements: Extraocular movements intact.     Pupils: Pupils are equal, round, and reactive to light.     Comments: Blue eyes.  Neck:     Vascular: No JVD.  Cardiovascular:     Rate and Rhythm: Normal rate and regular rhythm.     Heart sounds: Normal heart sounds. No murmur heard.   Pulmonary:     Effort: Pulmonary effort is normal. No respiratory distress.     Breath sounds: Normal breath sounds. No wheezing or rales.  Chest:     Chest wall: No tenderness.   Abdominal:     General: Bowel sounds are normal. There is no distension.     Palpations: Abdomen is soft. There is no hepatomegaly, splenomegaly or mass.     Tenderness: There is no abdominal tenderness. There is no guarding or rebound.  Musculoskeletal:        General: No tenderness. Normal range of motion.     Cervical back: Normal range of motion and neck supple.  Lymphadenopathy:     Head:     Right side of head: No preauricular, posterior auricular or occipital adenopathy.     Left side of head: No preauricular, posterior auricular or occipital adenopathy.     Cervical: No cervical adenopathy.     Upper Body:     Right upper body: No supraclavicular or axillary adenopathy.     Left upper body: No supraclavicular or axillary adenopathy.     Lower Body: No right inguinal adenopathy. No left inguinal adenopathy.  Skin:    General: Skin is  warm and dry.     Coloration: Skin is not pale.     Findings: No erythema or rash.  Neurological:     Mental Status: He is alert and oriented to person, place, and time. Mental status is at baseline.  Psychiatric:        Mood and Affect: Mood normal.        Behavior: Behavior normal.        Thought Content: Thought content normal.        Judgment: Judgment normal.    Appointment on 03/22/2020  Component Date Value Ref Range Status  . Sodium 03/22/2020 134* 135 - 145 mmol/L Final  . Potassium 03/22/2020 4.2  3.5 - 5.1 mmol/L Final  . Chloride 03/22/2020 100  98 - 111 mmol/L Final  . CO2 03/22/2020 28  22 - 32 mmol/L Final  . Glucose, Bld 03/22/2020 112* 70 - 99 mg/dL Final   Glucose reference range applies only to samples taken after fasting for at least 8 hours.  . BUN 03/22/2020 21  8 - 23 mg/dL Final  . Creatinine, Ser 03/22/2020 1.03  0.61 - 1.24 mg/dL Final  . Calcium 03/22/2020 8.7* 8.9 - 10.3 mg/dL Final  . Total Protein 03/22/2020 7.6  6.5 - 8.1 g/dL Final  . Albumin 03/22/2020 4.2  3.5 - 5.0 g/dL Final  . AST 03/22/2020 21  15 -  41 U/L Final  . ALT 03/22/2020 21  0 - 44 U/L Final  . Alkaline Phosphatase 03/22/2020 63  38 - 126 U/L Final  . Total Bilirubin 03/22/2020 1.4* 0.3 - 1.2 mg/dL Final  . GFR calc non Af Amer 03/22/2020 >60  >60 mL/min Final  . GFR calc Af Amer 03/22/2020 >60  >60 mL/min Final  . Anion gap 03/22/2020 6  5 - 15 Final   Performed at [facility], [address], [place], [place] [address]  . WBC 03/22/2020 5.5  4.0 - 10.5 K/uL Final  . RBC 03/22/2020 4.02* 4.22 - 5.81 MIL/uL Final  . Hemoglobin 03/22/2020 13.3  13.0 - 17.0 g/dL Final  . HCT 03/22/2020 39.4  39 - 52 % Final  . MCV 03/22/2020 98.0  80.0 - 100.0 fL Final  . MCH 03/22/2020 33.1  26.0 - 34.0 pg Final  . MCHC 03/22/2020 33.8  30.0 - 36.0 g/dL Final  . RDW 03/22/2020 13.6  11.5 - 15.5 % Final  . Platelets 03/22/2020 228  150 - 400 K/uL Final  . nRBC 03/22/2020 0.0  0.0 - 0.2 % Final  . Neutrophils Relative % 03/22/2020 43  % Final  . Neutro Abs 03/22/2020 2.4  1.7 - 7.7 K/uL Final  . Lymphocytes Relative 03/22/2020 41  % Final  . Lymphs Abs 03/22/2020 2.2  0.7 - 4.0 K/uL Final  . Monocytes Relative 03/22/2020 12  % Final  . Monocytes Absolute 03/22/2020 0.7  0 - 1 K/uL Final  . Eosinophils Relative 03/22/2020 3  % Final  . Eosinophils Absolute 03/22/2020 0.1  0 - 0 K/uL Final  . Basophils Relative 03/22/2020 1  % Final  . Basophils Absolute 03/22/2020 0.0  0 - 0 K/uL Final  . Immature Granulocytes 03/22/2020 0  % Final  . Abs Immature Granulocytes 03/22/2020 0.02  0.00 - 0.07 K/uL Final   Performed at [facility], [address] [place], [place] [address]    Assessment:  Eric Henderson is a 69 y.o. 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  Labs on 10/29/2018 included a hematocrit of 38.2, hemoglobin 12.6, MCV 99.0, platelets 233,000, WBC 5000 with an ANC of 1800.  Creatinine and calcium were normal.    SPEP has  been followed (gm/dL):  0.2 on 04/05/2018, 0.3 on 10/29/2018, 0.3 on 09/23/2019, and 0.4 on 03/22/2020.  IgA was 526 (61-437) on 10/29/2018 and 561 on 09/23/2019.  Lambda free light chains were 28.0 (ratio 0.84) on 10/29/2018, 28.5 (ratio 0.71 on 09/23/2019), and 25.2 (ratio 1.0) on 03/22/2020.  24-hour UPEP revealed no monoclonal protein on 10/22/2019.    Bone survey on 09/23/2019 revealed osteopenia versus artifact in the lumbar spine, ribs and pelvis. There was no discrete lytic or destructive osseous lesion. There was mild calcified aortic atherosclerosis.  Symptomatically, he feels good.  He denies any B symptoms.  Exam is stable.  Bilirubin is 1.4.  Plan: 1.   Labs today:  CBC with diff, CMP, SPEP, FLCA 2.   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  Beta-2 microglobulin was 1.7 and LDH 120 on 09/23/2019.             Remains no evidence of endorgan damage (CRAB criteria).             Continue to monitor. 3.   Elevated bilirubin   Bilirubin 1.4 (direct < 0.1).  No evidence of hemolysis.    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 4.   RTC in 6 months for MD assessment and labs (CBC with diff, CMP, SPEP, FLCA).   I discussed the assessment and treatment plan with the patient.  The patient was provided an opportunity to ask questions and all were answered.  The patient agreed with the plan and demonstrated an understanding of the instructions.  The patient was advised to call back if the symptoms worsen or if the condition fails to improve as anticipated.   Lequita Asal, MD, PhD    03/22/2020, 10:32 AM  I, Mirian Mo Tufford, am acting as Education administrator for [employer]. Mike Gip, MD, PhD.  I, Renell Coaxum C. Mike Gip, MD, have reviewed the above documentation for accuracy and completeness, and I agree with the above.

## 2020-03-22 ENCOUNTER — Ambulatory Visit: Payer: Medicare HMO | Admitting: Hematology and Oncology

## 2020-03-22 ENCOUNTER — Inpatient Hospital Stay (HOSPITAL_BASED_OUTPATIENT_CLINIC_OR_DEPARTMENT_OTHER): Payer: Medicare HMO | Admitting: Hematology and Oncology

## 2020-03-22 ENCOUNTER — Encounter: Payer: Self-pay | Admitting: Hematology and Oncology

## 2020-03-22 ENCOUNTER — Inpatient Hospital Stay: Payer: Medicare HMO | Attending: Hematology and Oncology

## 2020-03-22 ENCOUNTER — Other Ambulatory Visit: Payer: Medicare HMO

## 2020-03-22 ENCOUNTER — Other Ambulatory Visit: Payer: Self-pay

## 2020-03-22 VITALS — BP 113/75 | HR 52 | Temp 97.4°F | Wt 224.5 lb

## 2020-03-22 DIAGNOSIS — D472 Monoclonal gammopathy: Secondary | ICD-10-CM | POA: Insufficient documentation

## 2020-03-22 DIAGNOSIS — R17 Unspecified jaundice: Secondary | ICD-10-CM

## 2020-03-22 LAB — COMPREHENSIVE METABOLIC PANEL
ALT: 21 U/L (ref 0–44)
AST: 21 U/L (ref 15–41)
Albumin: 4.2 g/dL (ref 3.5–5.0)
Alkaline Phosphatase: 63 U/L (ref 38–126)
Anion gap: 6 (ref 5–15)
BUN: 21 mg/dL (ref 8–23)
CO2: 28 mmol/L (ref 22–32)
Calcium: 8.7 mg/dL — ABNORMAL LOW (ref 8.9–10.3)
Chloride: 100 mmol/L (ref 98–111)
Creatinine, Ser: 1.03 mg/dL (ref 0.61–1.24)
GFR calc Af Amer: >60 mL/min (ref >60)
GFR calc non Af Amer: >60 mL/min (ref >60)
Glucose, Bld: 112 mg/dL — ABNORMAL HIGH (ref 70–99)
Potassium: 4.2 mmol/L (ref 3.5–5.1)
Sodium: 134 mmol/L — ABNORMAL LOW (ref 135–145)
Total Bilirubin: 1.4 mg/dL — ABNORMAL HIGH (ref 0.3–1.2)
Total Protein: 7.6 g/dL (ref 6.5–8.1)

## 2020-03-22 LAB — CBC WITH DIFFERENTIAL/PLATELET
Abs Immature Granulocytes: 0.02 10*3/uL (ref 0.00–0.07)
Basophils Absolute: 0 10*3/uL (ref 0.0–0.1)
Basophils Relative: 1 %
Eosinophils Absolute: 0.1 10*3/uL (ref 0.0–0.5)
Eosinophils Relative: 3 %
HCT: 39.4 % (ref 39.0–52.0)
Hemoglobin: 13.3 g/dL (ref 13.0–17.0)
Immature Granulocytes: 0 %
Lymphocytes Relative: 41 %
Lymphs Abs: 2.2 10*3/uL (ref 0.7–4.0)
MCH: 33.1 pg (ref 26.0–34.0)
MCHC: 33.8 g/dL (ref 30.0–36.0)
MCV: 98 fL (ref 80.0–100.0)
Monocytes Absolute: 0.7 10*3/uL (ref 0.1–1.0)
Monocytes Relative: 12 %
Neutro Abs: 2.4 10*3/uL (ref 1.7–7.7)
Neutrophils Relative %: 43 %
Platelets: 228 10*3/uL (ref 150–400)
RBC: 4.02 MIL/uL — ABNORMAL LOW (ref 4.22–5.81)
RDW: 13.6 % (ref 11.5–15.5)
WBC: 5.5 10*3/uL (ref 4.0–10.5)
nRBC: 0 % (ref 0.0–0.2)

## 2020-03-22 LAB — BILIRUBIN, DIRECT: Bilirubin, Direct: <0.1 mg/dL (ref 0.0–0.2)

## 2020-03-22 NOTE — Progress Notes (Signed)
No new changes noted today 

## 2020-03-23 LAB — KAPPA/LAMBDA LIGHT CHAINS
Kappa free light chain: 25.3 mg/L — ABNORMAL HIGH (ref 3.3–19.4)
Kappa, lambda light chain ratio: 1 (ref 0.26–1.65)
Lambda free light chains: 25.2 mg/L (ref 5.7–26.3)

## 2020-03-23 LAB — PROTEIN ELECTROPHORESIS, SERUM
A/G Ratio: 1.2 (ref 0.7–1.7)
Albumin ELP: 3.9 g/dL (ref 2.9–4.4)
Alpha-1-Globulin: 0.2 g/dL (ref 0.0–0.4)
Alpha-2-Globulin: 0.6 g/dL (ref 0.4–1.0)
Beta Globulin: 1 g/dL (ref 0.7–1.3)
Gamma Globulin: 1.3 g/dL (ref 0.4–1.8)
Globulin, Total: 3.2 g/dL (ref 2.2–3.9)
M-Spike, %: 0.4 g/dL — ABNORMAL HIGH
Total Protein ELP: 7.1 g/dL (ref 6.0–8.5)

## 2020-04-28 ENCOUNTER — Other Ambulatory Visit: Payer: Self-pay

## 2020-04-28 ENCOUNTER — Ambulatory Visit: Payer: Medicare HMO | Admitting: Dermatology

## 2020-04-28 ENCOUNTER — Encounter: Payer: Self-pay | Admitting: Dermatology

## 2020-04-28 DIAGNOSIS — D229 Melanocytic nevi, unspecified: Secondary | ICD-10-CM

## 2020-04-28 DIAGNOSIS — L609 Nail disorder, unspecified: Secondary | ICD-10-CM | POA: Diagnosis not present

## 2020-04-28 DIAGNOSIS — L719 Rosacea, unspecified: Secondary | ICD-10-CM

## 2020-04-28 DIAGNOSIS — L578 Other skin changes due to chronic exposure to nonionizing radiation: Secondary | ICD-10-CM | POA: Diagnosis not present

## 2020-04-28 DIAGNOSIS — L918 Other hypertrophic disorders of the skin: Secondary | ICD-10-CM

## 2020-04-28 DIAGNOSIS — L814 Other melanin hyperpigmentation: Secondary | ICD-10-CM

## 2020-04-28 DIAGNOSIS — L820 Inflamed seborrheic keratosis: Secondary | ICD-10-CM

## 2020-04-28 DIAGNOSIS — L821 Other seborrheic keratosis: Secondary | ICD-10-CM

## 2020-04-28 DIAGNOSIS — Z1283 Encounter for screening for malignant neoplasm of skin: Secondary | ICD-10-CM | POA: Diagnosis not present

## 2020-04-28 DIAGNOSIS — D1800 Hemangioma unspecified site: Secondary | ICD-10-CM

## 2020-04-28 NOTE — Progress Notes (Signed)
° °  Follow-Up Visit   Subjective  Eric Henderson is a 69 y.o. male who presents for the following: Annual Exam (patient has noticed a few irritated skin lesions on his trunk that he would like checked). The patient presents for Total-Body Skin Exam (TBSE) for skin cancer screening and mole check.  The following portions of the chart were reviewed this encounter and updated as appropriate:  Tobacco   Allergies   Meds   Problems   Med Hx   Surg Hx   Fam Hx      Review of Systems:  No other skin or systemic complaints except as noted in HPI or Assessment and Plan.  Objective  Well appearing patient in no apparent distress; mood and affect are within normal limits.  A full examination was performed including scalp, head, eyes, ears, nose, lips, neck, chest, axillae, abdomen, back, buttocks, bilateral upper extremities, bilateral lower extremities, hands, feet, fingers, toes, fingernails, and toenails. All findings within normal limits unless otherwise noted below.  Objective  L chest: Erythematous keratotic or waxy stuck-on papule or plaque.   Objective  Face: Telangiectasias  Objective  R toenail: Purple macule under the nail   Assessment & Plan  Inflamed seborrheic keratosis L chest  Destruction of lesion - L chest Complexity: simple   Destruction method: cryotherapy   Informed consent: discussed and consent obtained   Timeout:  patient name, date of birth, surgical site, and procedure verified Lesion destroyed using liquid nitrogen: Yes   Region frozen until ice ball extended beyond lesion: Yes   Outcome: patient tolerated procedure well with no complications   Post-procedure details: wound care instructions given    Rosacea Face And actinic damage - Benign, observe - Discussed BBL laser treatment   Nail problem R toenail With subungual blood From trauma - benign, observe    Lentigines - Scattered tan macules - Discussed due to sun exposure - Benign, observe -  Call for any changes  Seborrheic Keratoses - Stuck-on, waxy, tan-brown papules and plaques  - Discussed benign etiology and prognosis. - Observe - Call for any changes  Melanocytic Nevi - Tan-brown and/or pink-flesh-colored symmetric macules and papules - Benign appearing on exam today - Observation - Call clinic for new or changing moles - Recommend daily use of broad spectrum spf 30+ sunscreen to sun-exposed areas.   Hemangiomas - Red papules - Discussed benign nature - Observe - Call for any changes  Actinic Damage - diffuse scaly erythematous macules with underlying dyspigmentation - Recommend daily broad spectrum sunscreen SPF 30+ to sun-exposed areas, reapply every 2 hours as needed.  - Call for new or changing lesions.  Acrochordons (Skin Tags) - Fleshy, skin-colored pedunculated papules - Benign appearing.  - Observe. - If desired, they can be removed with an in office procedure that is not covered by insurance. - Please call the clinic if you notice any new or changing lesions.  Skin cancer screening performed today.  Return in about 1 year (around 04/28/2021) for TBSE.  Luther Redo, CMA, am acting as scribe for Sarina Ser, MD .  Documentation: I have reviewed the above documentation for accuracy and completeness, and I agree with the above.  Sarina Ser, MD

## 2020-09-21 NOTE — Progress Notes (Signed)
[facility]  [address] [place], [place] [address] Phone: [phone]  Fax: [phone]   Clinic Day:  09/22/2020  Referring physician: Ezequiel Kayser, MD  Chief Complaint: Eric Henderson is a 70 y.o. male with IgA monoclonal gammopathy who is seen for 6 month assessment.  HPI: The patient was last seen in the medical oncology clinic on 03/22/2020. At that time, he felt good. He denied any B symptoms. Exam was stable. Hematocrit was 39.4, hemoglobin 13.3, platelets 228,000, WBC 5,500. Sodium was 134. Calcium was 8.7. Bilirubin was 1.4 (direct <0.1). M spike was 0.4 gm/dL. Kappa free light chains were 25.3, lambda free light chains 25.2, ratio 1.00 (normal).  Surveillance continued.  During the interim, he has felt good. He denies fevers, sweats, weight loss, infections, and bone pain. The ringing in his ears "will be there forever."   Past Medical History:  Diagnosis Date  . Dental bridge present    upper  . GERD (gastroesophageal reflux disease)   . Gout   . Hypercholesteremia   . Hypertension   . Motion sickness    swings    Past Surgical History:  Procedure Laterality Date  . COLONOSCOPY WITH ESOPHAGOGASTRODUODENOSCOPY (EGD)  09/28/2016  . KNEE ARTHROSCOPY WITH MEDIAL MENISECTOMY Left 08/29/2019   Procedure: LEFT KNEE ARTHROSCOPIC PARTIAL MEDIAL MENISCECTOMY;  Surgeon: Leim Fabry, MD;  Location: [facility];  Service: Orthopedics;  Laterality: Left;    History reviewed. No pertinent family history.  Social History:  reports that he has never smoked. He has never used smokeless tobacco. He reports current alcohol use of about 70.0 standard drinks of alcohol per week. He reports that he does not use drugs. He drinks 4-5 Vodka and tonic per night and a smattering of beer.  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  He works with a buddy on Music therapist, Social research officer, government. He works variable hours depending on the job.  He lives in  [place]. The patient is alone today.  Allergies: No Known Allergies  Current Medications: Current Outpatient Medications  Medication Sig Dispense Refill  . lisinopril-hydrochlorothiazide (PRINZIDE,ZESTORETIC) 10-12.5 MG tablet TK 1 T PO QD  1  . lovastatin (MEVACOR) 40 MG tablet Take 40 mg by mouth at bedtime.   1  . Meloxicam 10 MG CAPS Take by mouth.    . metoprolol succinate (TOPROL-XL) 25 MG 24 hr tablet Take 25 mg by mouth daily.     . Omega-3 Fatty Acids (FISH OIL) 1000 MG CAPS Take 1 capsule by mouth daily.     Marland Kitchen omeprazole (PRILOSEC) 40 MG capsule TAKE 1 CAPSULE (40 MG TOTAL) BY MOUTH ONCE DAILY PATIENT NEEDS AN APPOINTMENT FOR FURTHER REFILLS.  0  . vitamin B-12 (CYANOCOBALAMIN) 1000 MCG tablet Take 1,000 mcg by mouth daily.     Marland Kitchen HYDROcodone-acetaminophen (NORCO) 5-325 MG tablet Take 1-2 tablets by mouth every 4 (four) hours as needed for moderate pain or severe pain. (Patient not taking: No sig reported) 10 tablet 0  . ondansetron (ZOFRAN ODT) 4 MG disintegrating tablet Take 1 tablet (4 mg total) by mouth every 8 (eight) hours as needed for nausea or vomiting. (Patient not taking: No sig reported) 20 tablet 0   No current facility-administered medications for this visit.    Review of Systems  Constitutional: Negative for chills, diaphoresis, fever, malaise/fatigue and weight loss (up 13 lbs).       Feels "alright".  HENT: Positive for tinnitus (constant, stable). Negative  for congestion, ear discharge, ear pain, hearing loss, nosebleeds, sinus pain and sore throat.   Eyes: Negative.  Negative for blurred vision and double vision.  Respiratory: Negative.  Negative for cough, hemoptysis, sputum production and shortness of breath.   Cardiovascular: Negative.  Negative for chest pain, palpitations and leg swelling.  Gastrointestinal: Negative.  Negative for abdominal pain, blood in stool, constipation, diarrhea, heartburn, melena, nausea and vomiting.       Good appetite.   Genitourinary: Negative.  Negative for dysuria, frequency, hematuria and urgency.  Musculoskeletal: Negative.  Negative for back pain, joint pain, myalgias and neck pain.  Skin: Negative.  Negative for itching and rash.  Neurological: Negative.  Negative for dizziness, tingling, sensory change, weakness and headaches.  Endo/Heme/Allergies: Negative.  Does not bruise/bleed easily.  Psychiatric/Behavioral: Negative.  Negative for depression and memory loss. The patient is not nervous/anxious and does not have insomnia.   All other systems reviewed and are negative.  Performance status (ECOG): 0  Vitals Blood pressure (!) 163/88, pulse (!) 55, temperature (!) 97.1 F (36.2 C), temperature source Tympanic, resp. rate 17, weight 237 lb 3.4 oz (107.6 kg), SpO2 100 %.   Physical Exam Vitals and nursing note reviewed.  Constitutional:      General: He is not in acute distress.    Appearance: He is well-developed. He is not diaphoretic.  HENT:     Head: Normocephalic and atraumatic.     Comments: Gray hair.    Mouth/Throat:     Pharynx: No oropharyngeal exudate.  Eyes:     General: No scleral icterus.    Extraocular Movements: Extraocular movements intact.     Pupils: Pupils are equal, round, and reactive to light.     Comments: Blue eyes.  Neck:     Vascular: No JVD.  Cardiovascular:     Rate and Rhythm: Normal rate and regular rhythm.     Heart sounds: Normal heart sounds. No murmur heard.   Pulmonary:     Effort: Pulmonary effort is normal. No respiratory distress.     Breath sounds: Normal breath sounds. No wheezing or rales.  Chest:     Chest wall: No tenderness.  Breasts:     Right: No axillary adenopathy or supraclavicular adenopathy.     Left: No axillary adenopathy or supraclavicular adenopathy.    Abdominal:     General: Bowel sounds are normal. There is no distension.     Palpations: Abdomen is soft. There is no hepatomegaly, splenomegaly or mass.     Tenderness:  There is no abdominal tenderness. There is no guarding or rebound.  Musculoskeletal:        General: No tenderness. Normal range of motion.     Cervical back: Normal range of motion and neck supple.  Lymphadenopathy:     Head:     Right side of head: No preauricular, posterior auricular or occipital adenopathy.     Left side of head: No preauricular, posterior auricular or occipital adenopathy.     Cervical: No cervical adenopathy.     Upper Body:     Right upper body: No supraclavicular or axillary adenopathy.     Left upper body: No supraclavicular or axillary adenopathy.     Lower Body: No right inguinal adenopathy. No left inguinal adenopathy.  Skin:    General: Skin is warm and dry.     Coloration: Skin is not pale.     Findings: No erythema or rash.  Neurological:  Mental Status: He is alert and oriented to person, place, and time. Mental status is at baseline.  Psychiatric:        Mood and Affect: Mood normal.        Behavior: Behavior normal.        Thought Content: Thought content normal.        Judgment: Judgment normal.    Appointment on 09/22/2020  Component Date Value Ref Range Status  . Sodium 09/22/2020 138  135 - 145 mmol/L Final  . Potassium 09/22/2020 4.1  3.5 - 5.1 mmol/L Final  . Chloride 09/22/2020 102  98 - 111 mmol/L Final  . CO2 09/22/2020 21* 22 - 32 mmol/L Final  . Glucose, Bld 09/22/2020 107* 70 - 99 mg/dL Final   Glucose reference range applies only to samples taken after fasting for at least 8 hours.  . BUN 09/22/2020 13  8 - 23 mg/dL Final  . Creatinine, Ser 09/22/2020 1.04  0.61 - 1.24 mg/dL Final  . Calcium 09/22/2020 8.6* 8.9 - 10.3 mg/dL Final  . Total Protein 09/22/2020 7.1  6.5 - 8.1 g/dL Final  . Albumin 09/22/2020 3.9  3.5 - 5.0 g/dL Final  . AST 09/22/2020 33  15 - 41 U/L Final  . ALT 09/22/2020 37  0 - 44 U/L Final  . Alkaline Phosphatase 09/22/2020 58  38 - 126 U/L Final  . Total Bilirubin 09/22/2020 1.1  0.3 - 1.2 mg/dL Final  .  GFR, Estimated 09/22/2020 >60  >60 mL/min Final   Comment: (NOTE) Calculated using the CKD-EPI Creatinine Equation (2021)   . Anion gap 09/22/2020 15  5 - 15 Final   Performed at [facility], [address], [place], [place] [address]  . WBC 09/22/2020 5.1  4.0 - 10.5 K/uL Final  . RBC 09/22/2020 3.97* 4.22 - 5.81 MIL/uL Final  . Hemoglobin 09/22/2020 12.8* 13.0 - 17.0 g/dL Final  . HCT 09/22/2020 38.7* 39.0 - 52.0 % Final  . MCV 09/22/2020 97.5  80.0 - 100.0 fL Final  . MCH 09/22/2020 32.2  26.0 - 34.0 pg Final  . MCHC 09/22/2020 33.1  30.0 - 36.0 g/dL Final  . RDW 09/22/2020 13.3  11.5 - 15.5 % Final  . Platelets 09/22/2020 202  150 - 400 K/uL Final  . nRBC 09/22/2020 0.0  0.0 - 0.2 % Final  . Neutrophils Relative % 09/22/2020 41  % Final  . Neutro Abs 09/22/2020 2.1  1.7 - 7.7 K/uL Final  . Lymphocytes Relative 09/22/2020 44  % Final  . Lymphs Abs 09/22/2020 2.2  0.7 - 4.0 K/uL Final  . Monocytes Relative 09/22/2020 12  % Final  . Monocytes Absolute 09/22/2020 0.6  0.1 - 1.0 K/uL Final  . Eosinophils Relative 09/22/2020 2  % Final  . Eosinophils Absolute 09/22/2020 0.1  0.0 - 0.5 K/uL Final  . Basophils Relative 09/22/2020 1  % Final  . Basophils Absolute 09/22/2020 0.1  0.0 - 0.1 K/uL Final  . Immature Granulocytes 09/22/2020 0  % Final  . Abs Immature Granulocytes 09/22/2020 0.01  0.00 - 0.07 K/uL Final   Performed at [facility], [address] [place], [place] [address]    Assessment:  Eric Henderson is a 71 y.o. 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  Labs on 10/29/2018 included a hematocrit of 38.2, hemoglobin 12.6, MCV 99.0, platelets 233,000, WBC 5000 with an ANC of  1800.  Creatinine and calcium were normal.    SPEP has been followed (gm/dL):  0.2 on 04/05/2018, 0.3 on 10/29/2018, 0.3 on 09/23/2019, and 0.4 on 03/22/2020.  IgA was 526 (61-437) on 10/29/2018  and 561 on 09/23/2019.  Lambda free light chains were 28.0 (ratio 0.84) on 10/29/2018, 28.5 (ratio 0.71 on 09/23/2019), and 25.2 (ratio 1.0) on 03/22/2020.  24-hour UPEP revealed no monoclonal protein on 10/22/2019.    Bone survey on 09/23/2019 revealed osteopenia versus artifact in the lumbar spine, ribs and pelvis. There was no discrete lytic or destructive osseous lesion. There was mild calcified aortic atherosclerosis.  Symptomatically, he denies any B symptoms.  He denies any bone pain or infections.  Overall he is doing well.  Exam reveals no adenopathy or hepatosplenomegaly.  Plan: 1.   Labs today: CBC with diff, CMP, SPEP, FLCA. 2.   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  M-spike was 0.4 gm/dL on 03/22/2020.  M-spike is pending today.  Suspect M spike will be less secondary to decrease in total protein.             No evidence of endorgan damage (CRAB criteria).             Continue to monitor every 6 months. 3.   Mild normocytic anemia  Hematocrit 38.7.  Hemoglobin 12.8.  MCV 97.5.  Patient denies any bleeding.    Last colonoscopy was in 09/2016 (no report available).    Check iron studies today. 4.   Elevated bilirubin, resolved  Bilirubin 1.1 today.  Suspect possible Guilbert's disease. 5.   Hypocalcemia  Calcium 8.6 (corrected 8.68).  Patient will be contacted by RN after clinic regarding supplementation. 6.   RTC in 6 months for MD assessment and labs (CBC with diff, CMP, SPEP, FLCA).  I discussed the assessment and treatment plan with the patient.  The patient was provided an opportunity to ask questions and all were answered.  The patient agreed with the plan and demonstrated an understanding of the instructions.  The patient was advised to call back if the symptoms worsen or if the condition fails to improve as anticipated.   Lequita Asal, MD, PhD    09/22/2020, 10:43 AM  I, Mirian Mo Tufford, am acting as  Education administrator for [employer]. Mike Gip, MD, PhD.  I, Tyra Gural C. Mike Gip, MD, have reviewed the above documentation for accuracy and completeness, and I agree with the above.

## 2020-09-22 ENCOUNTER — Telehealth: Payer: Self-pay

## 2020-09-22 ENCOUNTER — Inpatient Hospital Stay: Payer: Medicare HMO

## 2020-09-22 ENCOUNTER — Other Ambulatory Visit: Payer: Self-pay

## 2020-09-22 ENCOUNTER — Inpatient Hospital Stay: Payer: Medicare HMO | Attending: Hematology and Oncology | Admitting: Hematology and Oncology

## 2020-09-22 ENCOUNTER — Encounter: Payer: Self-pay | Admitting: Hematology and Oncology

## 2020-09-22 VITALS — BP 163/88 | HR 55 | Temp 97.1°F | Resp 17 | Wt 237.2 lb

## 2020-09-22 DIAGNOSIS — I10 Essential (primary) hypertension: Secondary | ICD-10-CM | POA: Insufficient documentation

## 2020-09-22 DIAGNOSIS — E7800 Pure hypercholesterolemia, unspecified: Secondary | ICD-10-CM | POA: Diagnosis not present

## 2020-09-22 DIAGNOSIS — D472 Monoclonal gammopathy: Secondary | ICD-10-CM

## 2020-09-22 DIAGNOSIS — D649 Anemia, unspecified: Secondary | ICD-10-CM

## 2020-09-22 LAB — CBC WITH DIFFERENTIAL/PLATELET
Abs Immature Granulocytes: 0.01 10*3/uL (ref 0.00–0.07)
Basophils Absolute: 0.1 10*3/uL (ref 0.0–0.1)
Basophils Relative: 1 %
Eosinophils Absolute: 0.1 10*3/uL (ref 0.0–0.5)
Eosinophils Relative: 2 %
HCT: 38.7 % — ABNORMAL LOW (ref 39.0–52.0)
Hemoglobin: 12.8 g/dL — ABNORMAL LOW (ref 13.0–17.0)
Immature Granulocytes: 0 %
Lymphocytes Relative: 44 %
Lymphs Abs: 2.2 10*3/uL (ref 0.7–4.0)
MCH: 32.2 pg (ref 26.0–34.0)
MCHC: 33.1 g/dL (ref 30.0–36.0)
MCV: 97.5 fL (ref 80.0–100.0)
Monocytes Absolute: 0.6 10*3/uL (ref 0.1–1.0)
Monocytes Relative: 12 %
Neutro Abs: 2.1 10*3/uL (ref 1.7–7.7)
Neutrophils Relative %: 41 %
Platelets: 202 10*3/uL (ref 150–400)
RBC: 3.97 MIL/uL — ABNORMAL LOW (ref 4.22–5.81)
RDW: 13.3 % (ref 11.5–15.5)
WBC: 5.1 10*3/uL (ref 4.0–10.5)
nRBC: 0 % (ref 0.0–0.2)

## 2020-09-22 LAB — COMPREHENSIVE METABOLIC PANEL
ALT: 37 U/L (ref 0–44)
AST: 33 U/L (ref 15–41)
Albumin: 3.9 g/dL (ref 3.5–5.0)
Alkaline Phosphatase: 58 U/L (ref 38–126)
Anion gap: 15 (ref 5–15)
BUN: 13 mg/dL (ref 8–23)
CO2: 21 mmol/L — ABNORMAL LOW (ref 22–32)
Calcium: 8.6 mg/dL — ABNORMAL LOW (ref 8.9–10.3)
Chloride: 102 mmol/L (ref 98–111)
Creatinine, Ser: 1.04 mg/dL (ref 0.61–1.24)
GFR, Estimated: >60 mL/min (ref >60)
Glucose, Bld: 107 mg/dL — ABNORMAL HIGH (ref 70–99)
Potassium: 4.1 mmol/L (ref 3.5–5.1)
Sodium: 138 mmol/L (ref 135–145)
Total Bilirubin: 1.1 mg/dL (ref 0.3–1.2)
Total Protein: 7.1 g/dL (ref 6.5–8.1)

## 2020-09-22 LAB — IRON AND TIBC
Iron: 85 ug/dL (ref 45–182)
Saturation Ratios: 30 % (ref 17.9–39.5)
TIBC: 283 ug/dL (ref 250–450)
UIBC: 198 ug/dL

## 2020-09-22 LAB — FERRITIN: Ferritin: 53 ng/mL (ref 24–336)

## 2020-09-22 NOTE — Progress Notes (Signed)
Patient here for oncology follow-up appointment, expresses no complaints or concerns at this time.    

## 2020-09-23 LAB — PROTEIN ELECTROPHORESIS, SERUM
A/G Ratio: 1.4 (ref 0.7–1.7)
Albumin ELP: 3.8 g/dL (ref 2.9–4.4)
Alpha-1-Globulin: 0.2 g/dL (ref 0.0–0.4)
Alpha-2-Globulin: 0.5 g/dL (ref 0.4–1.0)
Beta Globulin: 0.9 g/dL (ref 0.7–1.3)
Gamma Globulin: 1.2 g/dL (ref 0.4–1.8)
Globulin, Total: 2.7 g/dL (ref 2.2–3.9)
M-Spike, %: 0.2 g/dL — ABNORMAL HIGH
Total Protein ELP: 6.5 g/dL (ref 6.0–8.5)

## 2020-09-23 LAB — KAPPA/LAMBDA LIGHT CHAINS
Kappa free light chain: 19.3 mg/L (ref 3.3–19.4)
Kappa, lambda light chain ratio: 0.94 (ref 0.26–1.65)
Lambda free light chains: 20.6 mg/L (ref 5.7–26.3)

## 2020-12-29 IMAGING — CR DG BONE SURVEY MET
9 of 10 series · 9 of 10 positions shown · non-contrast
Comparison: None.

CLINICAL DATA: 68-year-old male with monoclonal gammopathy of
unknown significance. Status post left knee surgery 3 weeks ago.

EXAM:
METASTATIC BONE SURVEY

[chest pa]
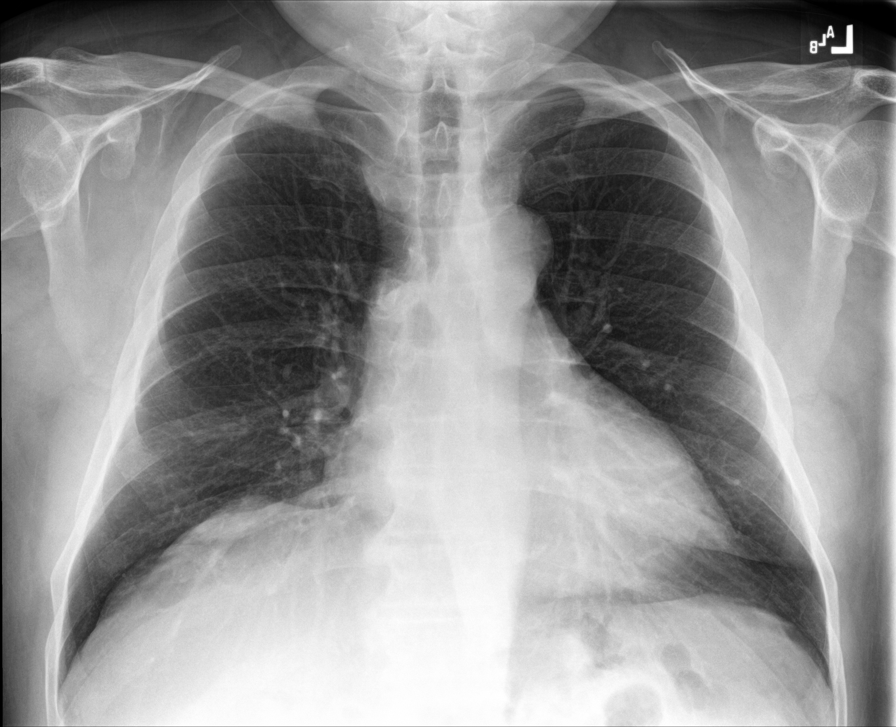

[skull lat]
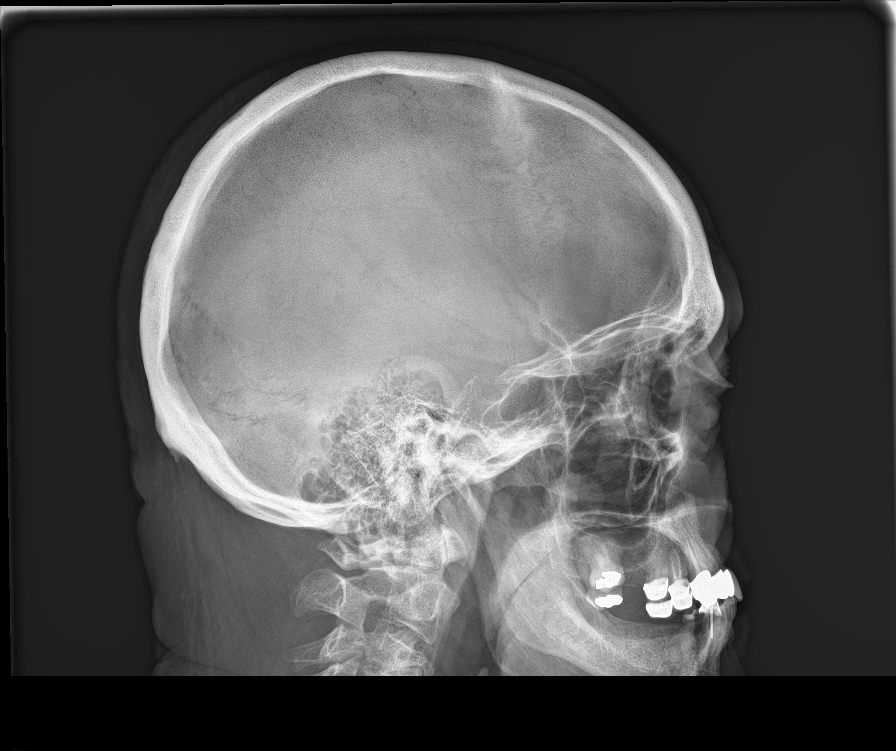

[c-spine lat]
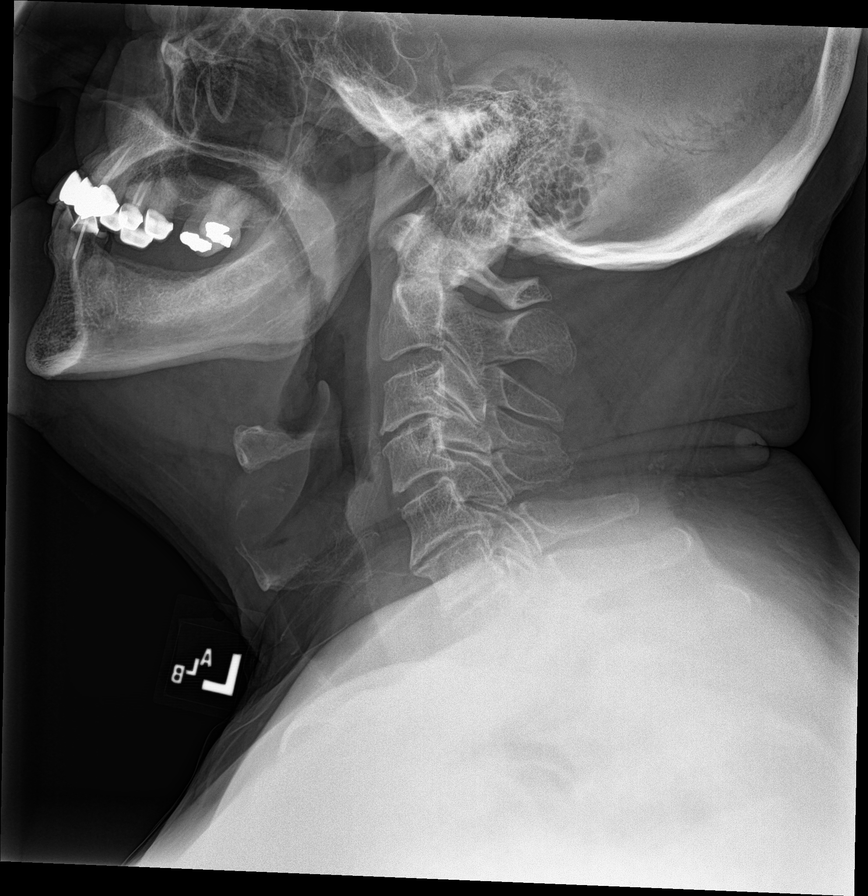

[c-spine ap]
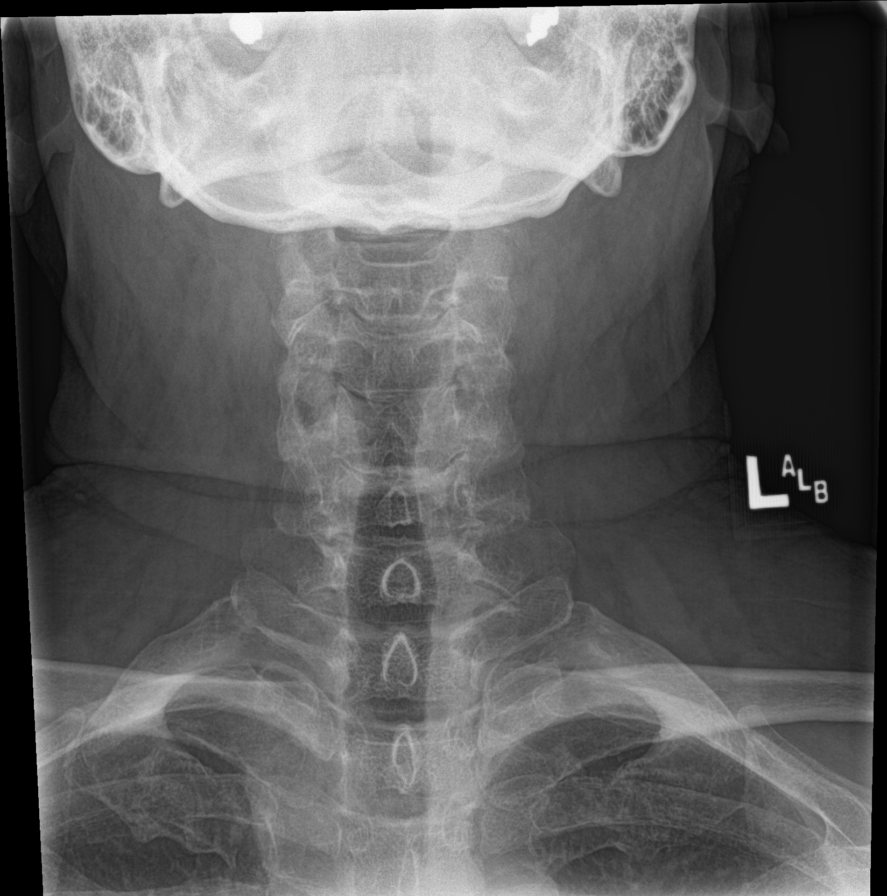

[shoulder ap (1 of 2)]
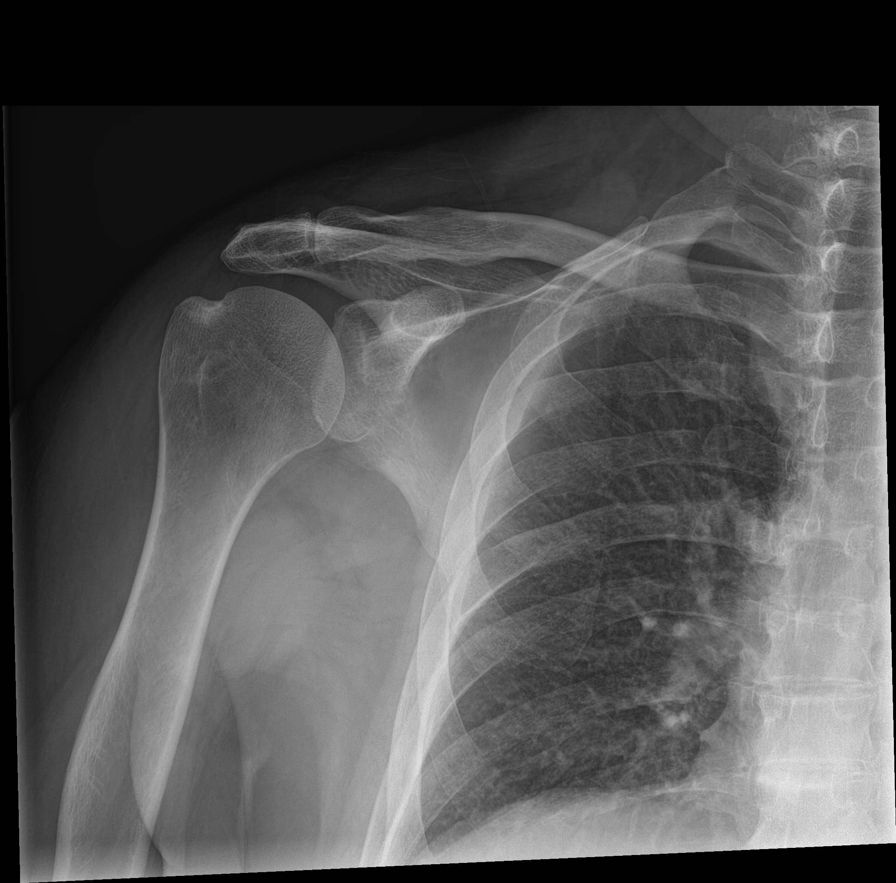

[shoulder ap (2 of 2)]
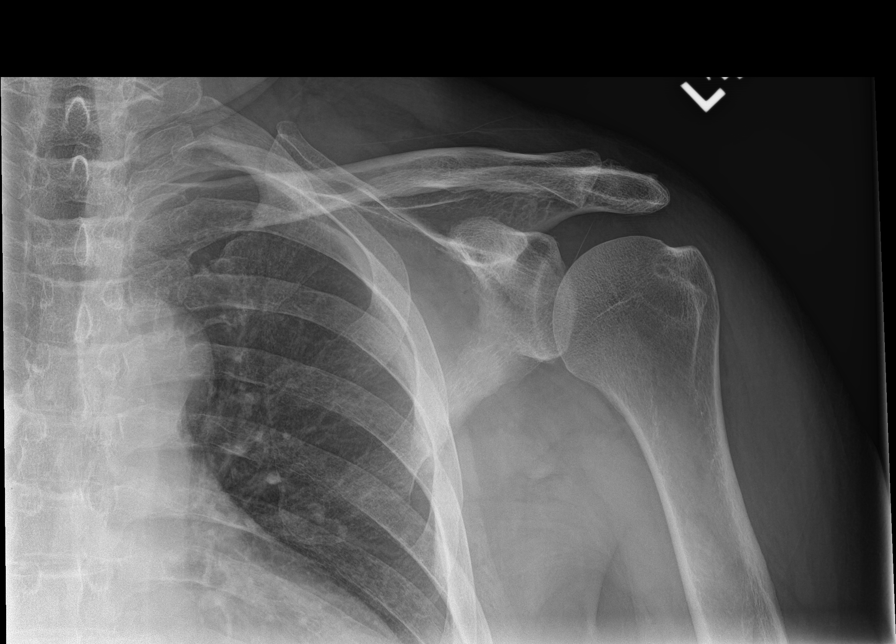

[humerus ap]
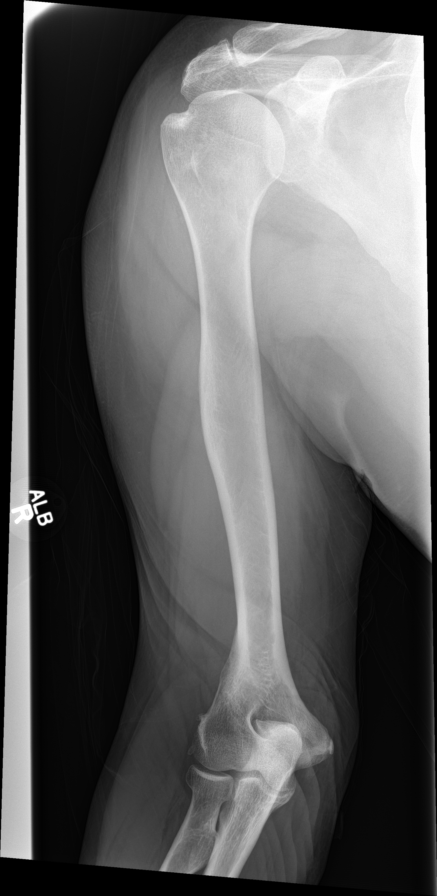

[forearm ap (1 of 2)]
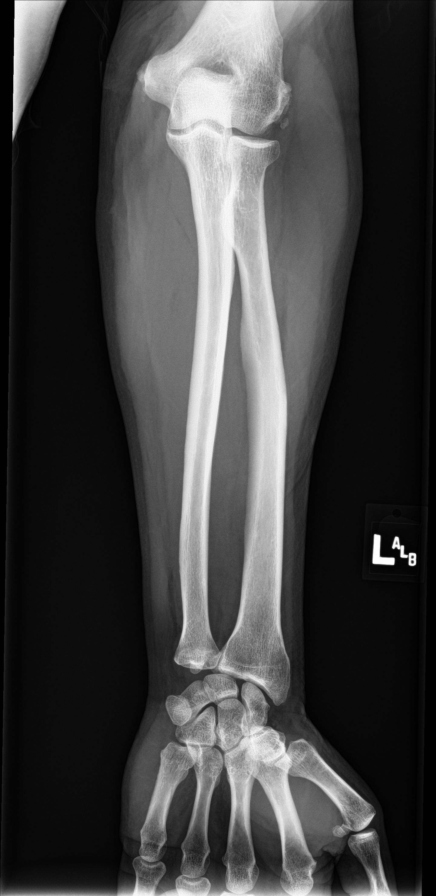

[forearm ap (2 of 2)]
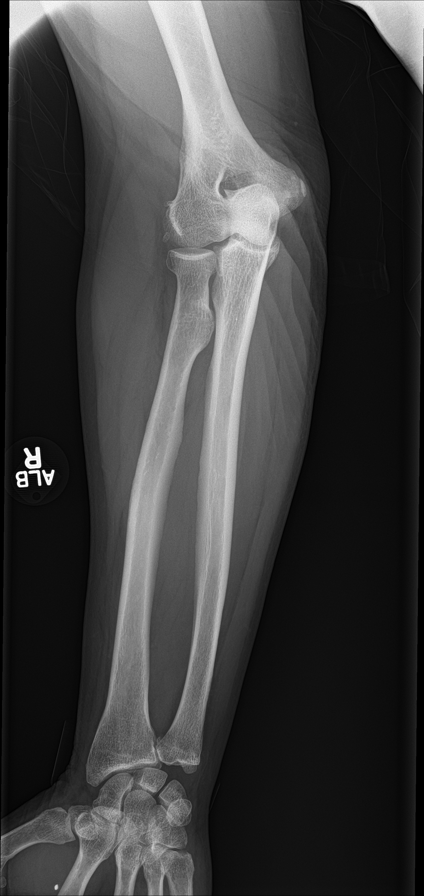

[9 of 10 positions shown; findings below may reference images not displayed]

FINDINGS: Skull bone mineralization is normal. Cervical spine mineralization
is normal. There is intermittent cervical disc and endplate
degeneration. Mild degenerative appearing retrolisthesis of C3 on
C4. Thoracic spine segmentation and bone mineralization within
normal limits. Bridging anterior endplate osteophytes at T9-T10 and
T10-T11. Relatively preserved thoracic disc spaces. Normal lumbar
spine segmentation.

There is suggestion of osteopenia in the lumbar spine (lateral
view), but no loss of vertebral body height. L4-L5 and L5-S1 disc
space loss with endplate and facet degeneration.

Bone mineralization in the pelvis and chest also appears mildly
decreased, but may be artifact due to technique. No discrete rib or
pelvic bone lesion. Bone mineralization appears normal about the
bilateral upper extremities. Likewise, bone mineralization in the
bilateral lower extremities appears normal.

Negative visible thoracic visceral contours. Negative abdominal
visceral contours aside from some Calcified aortic atherosclerosis.
Incidental pelvic phleboliths.
IMPRESSION: 1. Osteopenia versus artifact in the lumbar spine, ribs and pelvis.
But there is no discrete lytic or destructive osseous lesion.
2. Mild calcified aortic atherosclerosis.

## 2021-03-17 ENCOUNTER — Other Ambulatory Visit: Payer: Self-pay

## 2021-03-17 DIAGNOSIS — D649 Anemia, unspecified: Secondary | ICD-10-CM

## 2021-03-22 ENCOUNTER — Inpatient Hospital Stay: Payer: Medicare HMO | Admitting: Internal Medicine

## 2021-03-22 ENCOUNTER — Inpatient Hospital Stay: Payer: Medicare HMO

## 2021-03-22 NOTE — Assessment & Plan Note (Deleted)
#  M protein 0.2 g/dL; incidental-mild anemia; no evidence of any other organ dysfunction or neuropathy.   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 Patient is less likely at this time patient has any active myeloma-although given anemia [see discussion below]  # MILD ANEMIA- Hb 13 [N-14]; likely secondary to alcohol.  Recommend alcohol in moderation.  Clinically not related to slightly elevated M protein.  #Follow-up in 6 months labs-1 week prior [facility] CMP M protein kappa lambda]  Thank you Dr.Theis for allowing me to participate in the care of your pleasant patient. Please do not hesitate to contact me with questions or concerns in the interim.  # 45 minutes face-to-face with the patient discussing the above plan of care; more than 50% of time spent on prognosis/ natural history; counseling and coordination.  Cc: Dr.Theis.

## 2021-03-22 NOTE — Progress Notes (Deleted)
[facility] NOTE  Patient Care Team: Ezequiel Kayser, MD (Inactive) as PCP - General (Internal Medicine)  CHIEF COMPLAINTS/PURPOSE OF CONSULTATION:  ***  #  Oncology History   No history exists.     HISTORY OF PRESENTING ILLNESS:  Eric Henderson 70 y.o.  male    Review of Systems  Constitutional:  Negative for chills, diaphoresis, fever, malaise/fatigue and weight loss.  HENT:  Negative for nosebleeds and sore throat.   Eyes:  Negative for double vision.  Respiratory:  Negative for cough, hemoptysis, sputum production, shortness of breath and wheezing.   Cardiovascular:  Negative for chest pain, palpitations, orthopnea and leg swelling.  Gastrointestinal:  Negative for abdominal pain, blood in stool, constipation, diarrhea, heartburn, melena, nausea and vomiting.  Genitourinary:  Negative for dysuria, frequency and urgency.  Musculoskeletal:  Negative for back pain and joint pain.  Skin: Negative.  Negative for itching and rash.  Neurological:  Negative for dizziness, tingling, focal weakness, weakness and headaches.  Endo/Heme/Allergies:  Does not bruise/bleed easily.  Psychiatric/Behavioral:  Negative for depression. The patient is not nervous/anxious and does not have insomnia.     MEDICAL HISTORY:  Past Medical History:  Diagnosis Date  . Dental bridge present    upper  . GERD (gastroesophageal reflux disease)   . Gout   . Hypercholesteremia   . Hypertension   . Motion sickness    swings    SURGICAL HISTORY: Past Surgical History:  Procedure Laterality Date  . COLONOSCOPY WITH ESOPHAGOGASTRODUODENOSCOPY (EGD)  09/28/2016  . KNEE ARTHROSCOPY WITH MEDIAL MENISECTOMY Left 08/29/2019   Procedure: LEFT KNEE ARTHROSCOPIC PARTIAL MEDIAL MENISCECTOMY;  Surgeon: Leim Fabry, MD;  Location: [facility];  Service: Orthopedics;  Laterality: Left;    SOCIAL HISTORY: Social History   Socioeconomic History  . Marital status: Single    Spouse  name: Not on file  . Number of children: Not on file  . Years of education: Not on file  . Highest education level: Not on file  Occupational History  . Not on file  Tobacco Use  . Smoking status: Never  . Smokeless tobacco: Never  Vaping Use  . Vaping Use: Never used  Substance and Sexual Activity  . Alcohol use: Yes    Alcohol/week: 70.0 standard drinks    Types: 35 Cans of beer, 35 Shots of liquor per week    Comment: 10 drinks daily/ beer & vodka  . Drug use: Never  . Sexual activity: Not on file  Other Topics Concern  . Not on file  Social History Narrative  . Not on file   Social Determinants of Health   Financial Resource Strain: Not on file  Food Insecurity: Not on file  Transportation Needs: Not on file  Physical Activity: Not on file  Stress: Not on file  Social Connections: Not on file  Intimate Partner Violence: Not on file    FAMILY HISTORY: No family history on file.  ALLERGIES:  has No Known Allergies.  MEDICATIONS:  Current Outpatient Medications  Medication Sig Dispense Refill  . HYDROcodone-acetaminophen (NORCO) 5-325 MG tablet Take 1-2 tablets by mouth every 4 (four) hours as needed for moderate pain or severe pain. (Patient not taking: No sig reported) 10 tablet 0  . lisinopril-hydrochlorothiazide (PRINZIDE,ZESTORETIC) 10-12.5 MG tablet TK 1 T PO QD  1  . lovastatin (MEVACOR) 40 MG tablet Take 40 mg by mouth at bedtime.   1  . Meloxicam 10 MG CAPS Take by mouth.    Marland Kitchen  metoprolol succinate (TOPROL-XL) 25 MG 24 hr tablet Take 25 mg by mouth daily.     . Omega-3 Fatty Acids (FISH OIL) 1000 MG CAPS Take 1 capsule by mouth daily.     Marland Kitchen omeprazole (PRILOSEC) 40 MG capsule TAKE 1 CAPSULE (40 MG TOTAL) BY MOUTH ONCE DAILY PATIENT NEEDS AN APPOINTMENT FOR FURTHER REFILLS.  0  . ondansetron (ZOFRAN ODT) 4 MG disintegrating tablet Take 1 tablet (4 mg total) by mouth every 8 (eight) hours as needed for nausea or vomiting. (Patient not taking: No sig reported)  20 tablet 0  . vitamin B-12 (CYANOCOBALAMIN) 1000 MCG tablet Take 1,000 mcg by mouth daily.      No current facility-administered medications for this visit.      Marland Kitchen  PHYSICAL EXAMINATION: ECOG PERFORMANCE STATUS: {CHL ONC ECOG PS:[phone]}  There were no vitals filed for this visit. There were no vitals filed for this visit.  Physical Exam Vitals and nursing note reviewed.  Constitutional:      Comments: Alone.  Accompanied by family.   Ambulating independently   Ambulating with  cane   Ambulating with wheelchair      HENT:     Head: Normocephalic and atraumatic.     Mouth/Throat:     Pharynx: Oropharynx is clear.  Eyes:     Extraocular Movements: Extraocular movements intact.     Pupils: Pupils are equal, round, and reactive to light.  Cardiovascular:     Rate and Rhythm: Normal rate and regular rhythm.  Pulmonary:     Comments: Decreased breath sounds bilaterally.  Abdominal:     Palpations: Abdomen is soft.  Musculoskeletal:        General: Normal range of motion.     Cervical back: Normal range of motion.  Skin:    General: Skin is warm.  Neurological:     General: No focal deficit present.     Mental Status: He is alert and oriented to person, place, and time.  Psychiatric:        Behavior: Behavior normal.        Judgment: Judgment normal.    LABORATORY DATA:  I have reviewed the data as listed Lab Results  Component Value Date   WBC 5.1 09/22/2020   HGB 12.8 (L) 09/22/2020   HCT 38.7 (L) 09/22/2020   MCV 97.5 09/22/2020   PLT 202 09/22/2020   Recent Labs    03/22/20 0950 09/22/20 0937  NA 134* 138  K 4.2 4.1  CL 100 102  CO2 28 21*  GLUCOSE 112* 107*  BUN 21 13  CREATININE 1.03 1.04  CALCIUM 8.7* 8.6*  GFRNONAA >60 >60  GFRAA >60  --   PROT 7.6 7.1  ALBUMIN 4.2 3.9  AST 21 33  ALT 21 37  ALKPHOS 63 58  BILITOT 1.4* 1.1  BILIDIR <0.1  --     RADIOGRAPHIC STUDIES: I have personally reviewed the radiological images as  listed and agreed with the findings in the report. No results found.  ASSESSMENT & PLAN:   No problem-specific Assessment & Plan notes found for this encounter.    All questions were answered. The patient knows to call the clinic with any problems, questions or concerns.       Cammie Sickle, MD 03/22/2021 8:35 AM

## 2021-04-12 ENCOUNTER — Ambulatory Visit: Payer: Medicare HMO | Admitting: Internal Medicine

## 2021-04-12 ENCOUNTER — Other Ambulatory Visit: Payer: Medicare HMO

## 2021-05-04 ENCOUNTER — Encounter: Payer: Self-pay | Admitting: Dermatology

## 2021-05-05 ENCOUNTER — Ambulatory Visit: Payer: Medicare HMO | Admitting: Dermatology

## 2021-05-05 ENCOUNTER — Other Ambulatory Visit: Payer: Self-pay

## 2021-05-05 DIAGNOSIS — L719 Rosacea, unspecified: Secondary | ICD-10-CM | POA: Diagnosis not present

## 2021-05-05 DIAGNOSIS — Z1283 Encounter for screening for malignant neoplasm of skin: Secondary | ICD-10-CM

## 2021-05-05 DIAGNOSIS — L570 Actinic keratosis: Secondary | ICD-10-CM

## 2021-05-05 DIAGNOSIS — L820 Inflamed seborrheic keratosis: Secondary | ICD-10-CM | POA: Diagnosis not present

## 2021-05-05 DIAGNOSIS — L905 Scar conditions and fibrosis of skin: Secondary | ICD-10-CM | POA: Diagnosis not present

## 2021-05-05 NOTE — Patient Instructions (Addendum)
Instructions for [facility] Medications  One or more of your medications was sent to the [facility] mail order compounding pharmacy. You will receive an email from them and can purchase the medicine through that link. It will then be mailed to your home at the address you confirmed. If for any reason you do not receive an email from them, please check your spam folder. If you still do not find the email, please let us know. [facility] phone number is [phone].       Cryotherapy Aftercare  Wash gently with soap and water everyday.   Apply Vaseline and Band-Aid daily until healed.   If you have any questions or concerns for your doctor, please call our main line at [phone] and press option 4 to reach your doctor's medical assistant. If no one answers, please leave a voicemail as directed and we will return your call as soon as possible. Messages left after 4 pm will be answered the following business day.   You may also send [place] a message via [place]. We typically respond to MyChart messages within 1-2 business days.  For prescription refills, please ask your pharmacy to contact our office. Our fax number is [phone].  If you have an urgent issue when the clinic is closed that cannot wait until the next business day, you can page your doctor at the number below.    Please note that while we do our best to be available for urgent issues outside of office hours, we are not available 24/7.   If you have an urgent issue and are unable to reach [place], you may choose to seek medical care at your doctor's office, retail clinic, urgent care center, or emergency room.  If you have a medical emergency, please immediately call 911 or go to the emergency department.  Pager Numbers  - Dr. Nehemiah Massed: [phone]  - Dr. Laurence Ferrari: [phone]  - Dr. Nicole Kindred: [phone]  In the event of inclement weather, please call our main line at [phone] for an update on the status of  any delays or closures.  Dermatology Medication Tips: Please keep the boxes that topical medications come in in order to help keep track of the instructions about where and how to use these. Pharmacies typically print the medication instructions only on the boxes and not directly on the medication tubes.   If your medication is too expensive, please contact our office at [phone] option 4 or send [place] a message through [place].   We are unable to tell what your co-pay for medications will be in advance as this is different depending on your insurance coverage. However, we may be able to find a substitute medication at lower cost or fill out paperwork to get insurance to cover a needed medication.   If a prior authorization is required to get your medication covered by your insurance company, please allow [place] 1-2 business days to complete this process.  Drug prices often vary depending on where the prescription is filled and some pharmacies may offer cheaper prices.  The website www.goodrx.com contains coupons for medications through different pharmacies. The prices here do not account for what the cost may be with help from insurance (it may be cheaper with your insurance), but the website can give you the price if you did not use any insurance.  - You can print the associated coupon and take it with your prescription to the pharmacy.  - You may also stop by our office during regular  business hours and pick up a GoodRx coupon card.  - If you need your prescription sent electronically to a different pharmacy, notify our office through [facility] or by phone at [phone] option 4.

## 2021-05-05 NOTE — Progress Notes (Signed)
   Follow-Up Visit   Subjective  Eric Henderson is a 70 y.o. male who presents for the following: Annual Exam (No history of skin cancer - TBSE today). The patient presents for Total-Body Skin Exam (TBSE) for skin cancer screening and mole check.  The following portions of the chart were reviewed this encounter and updated as appropriate:   Tobacco  Allergies  Meds  Problems  Med Hx  Surg Hx  Fam Hx     Review of Systems:  No other skin or systemic complaints except as noted in HPI or Assessment and Plan.  Objective  Well appearing patient in no apparent distress; mood and affect are within normal limits.  A full examination was performed including scalp, head, eyes, ears, nose, lips, neck, chest, axillae, abdomen, back, buttocks, bilateral upper extremities, bilateral lower extremities, hands, feet, fingers, toes, fingernails, and toenails. All findings within normal limits unless otherwise noted below.  Right Shoulder - Posterior Scar  Right Ear Erythematous thin papules/macules with gritty scale.   Bilateral hands/arms x 15 (15), Mid Back Erythematous keratotic or waxy stuck-on papule or plaque.   Face Dilated blood vessels   Assessment & Plan  Scar Right Shoulder - Posterior Secondary to previous abscess  AK (actinic keratosis) Right Ear Destruction of lesion - Right Ear Complexity: simple   Destruction method: cryotherapy   Informed consent: discussed and consent obtained   Timeout:  patient name, date of birth, surgical site, and procedure verified Lesion destroyed using liquid nitrogen: Yes   Region frozen until ice ball extended beyond lesion: Yes   Outcome: patient tolerated procedure well with no complications   Post-procedure details: wound care instructions given    Inflamed seborrheic keratosis Bilateral hands/arms x 15 (15); Mid Back Destruction of lesion - Bilateral hands/arms x 15 Complexity: simple   Destruction method: cryotherapy    Informed consent: discussed and consent obtained   Timeout:  patient name, date of birth, surgical site, and procedure verified Lesion destroyed using liquid nitrogen: Yes   Region frozen until ice ball extended beyond lesion: Yes   Outcome: patient tolerated procedure well with no complications   Post-procedure details: wound care instructions given    Rosacea Face Rosacea is a chronic progressive skin condition usually affecting the face of adults, causing redness and/or acne bumps. It is treatable but not curable. It sometimes affects the eyes (ocular rosacea) as well. It may respond to topical and/or systemic medication and can flare with stress, sun exposure, alcohol, exercise and some foods.  Daily application of broad spectrum spf 30+ sunscreen to face is recommended to reduce flares.  Discussed BBL/laser treatments. Advised patient may need up to 3 treatments for best results and fee is $350/treatment.  Will prescribe [facility] metronidazole/ivermectin/azelaic acid twice daily as needed to affected areas on the face. The patient was advised this is not covered by insurance since it is made by a compounding pharmacy. They will receive an email to check out and the medication will be mailed to their home.    Skin cancer screening  Return in about 1 year (around 05/05/2022) for TBSE.  I, Ashok Cordia, CMA, am acting as scribe for Sarina Ser, MD . Documentation: I have reviewed the above documentation for accuracy and completeness, and I agree with the above.  Sarina Ser, MD

## 2021-05-08 ENCOUNTER — Encounter: Payer: Self-pay | Admitting: Dermatology

## 2022-05-10 ENCOUNTER — Ambulatory Visit: Payer: Medicare HMO | Admitting: Dermatology

## 2022-05-10 ENCOUNTER — Encounter: Payer: Self-pay | Admitting: Dermatology

## 2022-05-10 DIAGNOSIS — D1801 Hemangioma of skin and subcutaneous tissue: Secondary | ICD-10-CM

## 2022-05-10 DIAGNOSIS — D229 Melanocytic nevi, unspecified: Secondary | ICD-10-CM

## 2022-05-10 DIAGNOSIS — L578 Other skin changes due to chronic exposure to nonionizing radiation: Secondary | ICD-10-CM | POA: Diagnosis not present

## 2022-05-10 DIAGNOSIS — I781 Nevus, non-neoplastic: Secondary | ICD-10-CM

## 2022-05-10 DIAGNOSIS — Z1283 Encounter for screening for malignant neoplasm of skin: Secondary | ICD-10-CM

## 2022-05-10 DIAGNOSIS — L814 Other melanin hyperpigmentation: Secondary | ICD-10-CM | POA: Diagnosis not present

## 2022-05-10 DIAGNOSIS — L821 Other seborrheic keratosis: Secondary | ICD-10-CM

## 2022-05-10 DIAGNOSIS — L570 Actinic keratosis: Secondary | ICD-10-CM | POA: Diagnosis not present

## 2022-05-10 NOTE — Patient Instructions (Addendum)
Cryotherapy Aftercare  Wash gently with soap and water everyday.   Apply Vaseline and Band-Aid daily until healed.     Due to recent changes in healthcare laws, you may see results of your pathology and/or laboratory studies on MyChart before the doctors have had a chance to review them. We understand that in some cases there may be results that are confusing or concerning to you. Please understand that not all results are received at the same time and often the doctors may need to interpret multiple results in order to provide you with the best plan of care or course of treatment. Therefore, we ask that you please give us 2 business days to thoroughly review all your results before contacting the office for clarification. Should we see a critical lab result, you will be contacted sooner.   If You Need Anything After Your Visit  If you have any questions or concerns for your doctor, please call our main line at [phone] and press option 4 to reach your doctor's medical assistant. If no one answers, please leave a voicemail as directed and we will return your call as soon as possible. Messages left after 4 pm will be answered the following business day.   You may also send us a message via MyChart. We typically respond to MyChart messages within 1-2 business days.  For prescription refills, please ask your pharmacy to contact our office. Our fax number is [phone].  If you have an urgent issue when the clinic is closed that cannot wait until the next business day, you can page your doctor at the number below.    Please note that while we do our best to be available for urgent issues outside of office hours, we are not available 24/7.   If you have an urgent issue and are unable to reach us, you may choose to seek medical care at your doctor's office, retail clinic, urgent care center, or emergency room.  If you have a medical emergency, please immediately call 911 or go to the  emergency department.  Pager Numbers  - Dr. Kowalski: [phone]  - Dr. Moye: [phone]  - Dr. Stewart: [phone]  In the event of inclement weather, please call our main line at [phone] for an update on the status of any delays or closures.  Dermatology Medication Tips: Please keep the boxes that topical medications come in in order to help keep track of the instructions about where and how to use these. Pharmacies typically print the medication instructions only on the boxes and not directly on the medication tubes.   If your medication is too expensive, please contact our office at [phone] option 4 or send us a message through MyChart.   We are unable to tell what your co-pay for medications will be in advance as this is different depending on your insurance coverage. However, we may be able to find a substitute medication at lower cost or fill out paperwork to get insurance to cover a needed medication.   If a prior authorization is required to get your medication covered by your insurance company, please allow us 1-2 business days to complete this process.  Drug prices often vary depending on where the prescription is filled and some pharmacies may offer cheaper prices.  The website www.goodrx.com contains coupons for medications through different pharmacies. The prices here do not account for what the cost may be with help from insurance (it may be cheaper with your insurance), but the website can   give you the price if you did not use any insurance.  - You can print the associated coupon and take it with your prescription to the pharmacy.  - You may also stop by our office during regular business hours and pick up a GoodRx coupon card.  - If you need your prescription sent electronically to a different pharmacy, notify our office through [place] MyChart or by phone at [phone] option 4.     Si Usted Necesita Algo Despus de Su Visita  Tambin puede  enviarnos un mensaje a travs de MyChart. Por lo general respondemos a los mensajes de MyChart en el transcurso de 1 a 2 das hbiles.  Para renovar recetas, por favor pida a su farmacia que se ponga en contacto con nuestra oficina. Nuestro nmero de fax es el [phone].  Si tiene un asunto urgente cuando la clnica est cerrada y que no puede esperar hasta el siguiente da hbil, puede llamar/localizar a su doctor(a) al nmero que aparece a continuacin.   Por favor, tenga en cuenta que aunque hacemos todo lo posible para estar disponibles para asuntos urgentes fuera del horario de oficina, no estamos disponibles las 24 horas del da, los 7 das de la semana.   Si tiene un problema urgente y no puede comunicarse con nosotros, puede optar por buscar atencin mdica  en el consultorio de su doctor(a), en una clnica privada, en un centro de atencin urgente o en una sala de emergencias.  Si tiene una emergencia mdica, por favor llame inmediatamente al 911 o vaya a la sala de emergencias.  Nmeros de bper  - Dr. Kowalski: [phone]  - Dra. Moye: [phone]  - Dra. Stewart: [phone]  En caso de inclemencias del tiempo, por favor llame a nuestra lnea principal al [phone] para una actualizacin sobre el estado de cualquier retraso o cierre.  Consejos para la medicacin en dermatologa: Por favor, guarde las cajas en las que vienen los medicamentos de uso tpico para ayudarle a seguir las instrucciones sobre dnde y cmo usarlos. Las farmacias generalmente imprimen las instrucciones del medicamento slo en las cajas y no directamente en los tubos del medicamento.   Si su medicamento es muy caro, por favor, pngase en contacto con nuestra oficina llamando al [phone] y presione la opcin 4 o envenos un mensaje a travs de MyChart.   No podemos decirle cul ser su copago por los medicamentos por adelantado ya que esto es diferente dependiendo de la cobertura de su seguro.  Sin embargo, es posible que podamos encontrar un medicamento sustituto a menor costo o llenar un formulario para que el seguro cubra el medicamento que se considera necesario.   Si se requiere una autorizacin previa para que su compaa de seguros cubra su medicamento, por favor permtanos de 1 a 2 das hbiles para completar este proceso.  Los precios de los medicamentos varan con frecuencia dependiendo del lugar de dnde se surte la receta y alguna farmacias pueden ofrecer precios ms baratos.  El sitio web www.goodrx.com tiene cupones para medicamentos de diferentes farmacias. Los precios aqu no tienen en cuenta lo que podra costar con la ayuda del seguro (puede ser ms barato con su seguro), pero el sitio web puede darle el precio si no utiliz ningn seguro.  - Puede imprimir el cupn correspondiente y llevarlo con su receta a la farmacia.  - Tambin puede pasar por nuestra oficina durante el horario de atencin regular y recoger una tarjeta de cupones de GoodRx.  -   Si necesita que su receta se enve electrnicamente a una farmacia diferente, informe a nuestra oficina a travs de MyChart de [place] o por telfono llamando al [phone] y presione la opcin 4.  

## 2022-05-10 NOTE — Progress Notes (Signed)
Follow-Up Visit   Subjective  Eric Henderson is a 72 y.o. male who presents for the following: Annual Exam (Mole check ). The patient presents for Total-Body Skin Exam (TBSE) for skin cancer screening and mole check.  The patient has spots, moles and lesions to be evaluated, some may be new or changing and the patient has concerns that these could be cancer.   The following portions of the chart were reviewed this encounter and updated as appropriate:   Tobacco  Allergies  Meds  Problems  Med Hx  Surg Hx  Fam Hx     Review of Systems:  No other skin or systemic complaints except as noted in HPI or Assessment and Plan.  Objective  Well appearing patient in no apparent distress; mood and affect are within normal limits.  A full examination was performed including scalp, head, eyes, ears, nose, lips, neck, chest, axillae, abdomen, back, buttocks, bilateral upper extremities, bilateral lower extremities, hands, feet, fingers, toes, fingernails, and toenails. All findings within normal limits unless otherwise noted below.  face,hands x 24 (24) Erythematous thin papules/macules with gritty scale.   face Dilated blood vessels    Assessment & Plan  AK (actinic keratosis) (24) face,hands x 24 Actinic keratoses are precancerous spots that appear secondary to cumulative UV radiation exposure/sun exposure over time. They are chronic with expected duration over 1 year. A portion of actinic keratoses will progress to squamous cell carcinoma of the skin. It is not possible to reliably predict which spots will progress to skin cancer and so treatment is recommended to prevent development of skin cancer.  Recommend daily broad spectrum sunscreen SPF 30+ to sun-exposed areas, reapply every 2 hours as needed.  Recommend staying in the shade or wearing long sleeves, sun glasses (UVA+UVB protection) and wide brim hats (4-inch brim around the entire circumference of the hat). Call for new or  changing lesions.   Destruction of lesion - face,hands x 24 Complexity: simple   Destruction method: cryotherapy   Informed consent: discussed and consent obtained   Timeout:  patient name, date of birth, surgical site, and procedure verified Lesion destroyed using liquid nitrogen: Yes   Region frozen until ice ball extended beyond lesion: Yes   Outcome: patient tolerated procedure well with no complications   Post-procedure details: wound care instructions given    Telangiectasia face Discussed the treatment option of BBL/laser.  Typically we recommend 1-3 treatment sessions about 5-8 weeks apart for best results.  The patient's condition may require "maintenance treatments" in the future.  The fee for BBL / laser treatments is $350 per treatment session for the whole face.  A fee can be quoted for other parts of the body. Insurance typically does not pay for BBL/laser treatments and therefore the fee is an out-of-pocket cost.   Lentigines - Scattered tan macules - Due to sun exposure - Benign-appearing, observe - Recommend daily broad spectrum sunscreen SPF 30+ to sun-exposed areas, reapply every 2 hours as needed. - Call for any changes  Seborrheic Keratoses - Stuck-on, waxy, tan-brown papules and/or plaques  - Benign-appearing - Discussed benign etiology and prognosis. - Observe - Call for any changes  Melanocytic Nevi - Tan-brown and/or pink-flesh-colored symmetric macules and papules - Benign appearing on exam today - Observation - Call clinic for new or changing moles - Recommend daily use of broad spectrum spf 30+ sunscreen to sun-exposed areas.   Hemangiomas - Red papules - Discussed benign nature - Observe - Call for  any changes  Actinic Damage - Chronic condition, secondary to cumulative UV/sun exposure - diffuse scaly erythematous macules with underlying dyspigmentation - Recommend daily broad spectrum sunscreen SPF 30+ to sun-exposed areas, reapply every 2  hours as needed.  - Staying in the shade or wearing long sleeves, sun glasses (UVA+UVB protection) and wide brim hats (4-inch brim around the entire circumference of the hat) are also recommended for sun protection.  - Call for new or changing lesions.  Skin cancer screening performed today.   Return in about 1 year (around 05/11/2023) for TBSE, hx of Aks .  IMarye Round, CMA, am acting as scribe for Sarina Ser, MD .  Documentation: I have reviewed the above documentation for accuracy and completeness, and I agree with the above.  Sarina Ser, MD

## 2023-05-24 ENCOUNTER — Ambulatory Visit: Payer: Medicare HMO | Admitting: Dermatology

## 2023-05-24 ENCOUNTER — Encounter: Payer: Self-pay | Admitting: Dermatology

## 2023-05-24 VITALS — BP 125/77 | HR 73

## 2023-05-24 DIAGNOSIS — D229 Melanocytic nevi, unspecified: Secondary | ICD-10-CM

## 2023-05-24 DIAGNOSIS — L570 Actinic keratosis: Secondary | ICD-10-CM

## 2023-05-24 DIAGNOSIS — L820 Inflamed seborrheic keratosis: Secondary | ICD-10-CM

## 2023-05-24 DIAGNOSIS — Z1283 Encounter for screening for malignant neoplasm of skin: Secondary | ICD-10-CM

## 2023-05-24 DIAGNOSIS — Z872 Personal history of diseases of the skin and subcutaneous tissue: Secondary | ICD-10-CM

## 2023-05-24 DIAGNOSIS — L578 Other skin changes due to chronic exposure to nonionizing radiation: Secondary | ICD-10-CM | POA: Diagnosis not present

## 2023-05-24 DIAGNOSIS — L814 Other melanin hyperpigmentation: Secondary | ICD-10-CM

## 2023-05-24 DIAGNOSIS — D1801 Hemangioma of skin and subcutaneous tissue: Secondary | ICD-10-CM

## 2023-05-24 DIAGNOSIS — W908XXA Exposure to other nonionizing radiation, initial encounter: Secondary | ICD-10-CM

## 2023-05-24 DIAGNOSIS — L918 Other hypertrophic disorders of the skin: Secondary | ICD-10-CM

## 2023-05-24 DIAGNOSIS — L821 Other seborrheic keratosis: Secondary | ICD-10-CM

## 2023-05-24 NOTE — Progress Notes (Signed)
Follow-Up Visit   Subjective  Eric Henderson is a 72 y.o. male who presents for the following: Skin Cancer Screening and Full Body Skin Exam. No personal hx of skin cancer or dysplastic nevi. Hx of actinic keratoses.   The patient presents for Total-Body Skin Exam (TBSE) for skin cancer screening and mole check. The patient has spots, moles and lesions to be evaluated, some may be new or changing and the patient may have concern these could be cancer.    The following portions of the chart were reviewed this encounter and updated as appropriate: medications, allergies, medical history  Review of Systems:  No other skin or systemic complaints except as noted in HPI or Assessment and Plan.  Objective  Well appearing patient in no apparent distress; mood and affect are within normal limits.  A full examination was performed including scalp, head, eyes, ears, nose, lips, neck, chest, axillae, abdomen, back, buttocks, bilateral upper extremities, bilateral lower extremities, hands, feet, fingers, toes, fingernails, and toenails. All findings within normal limits unless otherwise noted below.   Relevant physical exam findings are noted in the Assessment and Plan.  Right Upper Back x5, R deltoid x1, R forearm x1 (7) Erythematous keratotic or waxy stuck-on papule or plaque.  Left Eyebrow x1 Erythematous thin papules/macules with gritty scale.     Assessment & Plan   SKIN CANCER SCREENING PERFORMED TODAY.  ACTINIC DAMAGE - Chronic condition, secondary to cumulative UV/sun exposure - diffuse scaly erythematous macules with underlying dyspigmentation - Recommend daily broad spectrum sunscreen SPF 30+ to sun-exposed areas, reapply every 2 hours as needed.  - Staying in the shade or wearing long sleeves, sun glasses (UVA+UVB protection) and wide brim hats (4-inch brim around the entire circumference of the hat) are also recommended for sun protection.  - Call for new or changing  lesions.  LENTIGINES, SEBORRHEIC KERATOSES, HEMANGIOMAS - Benign normal skin lesions - Benign-appearing - Call for any changes  MELANOCYTIC NEVI - Tan-brown and/or pink-flesh-colored symmetric macules and papules - Benign appearing on exam today - Observation - Call clinic for new or changing moles - Recommend daily use of broad spectrum spf 30+ sunscreen to sun-exposed areas.   Acrochordons (Skin Tags) - Fleshy, skin-colored pedunculated papules - Benign appearing.  - Observe. - If desired, they can be removed with an in office procedure that is not covered by insurance. - Please call the clinic if you notice any new or changing lesions.     Inflamed seborrheic keratosis (7) Right Upper Back x5, R deltoid x1, R forearm x1  Symptomatic, irritating, patient would like treated.   Right forearm: cleansed with Puracyn, Mupirocin and band-aid applied after LN2 treatment.   Destruction of lesion - Right Upper Back x5, R deltoid x1, R forearm x1 (7) Complexity: simple   Destruction method: cryotherapy   Informed consent: discussed and consent obtained   Timeout:  patient name, date of birth, surgical site, and procedure verified Lesion destroyed using liquid nitrogen: Yes   Region frozen until ice ball extended beyond lesion: Yes   Outcome: patient tolerated procedure well with no complications   Post-procedure details: wound care instructions given   Additional details:  Prior to procedure, discussed risks of blister formation, small wound, skin dyspigmentation, or rare scar following cryotherapy. Recommend Vaseline ointment to treated areas while healing.   AK (actinic keratosis) Left Eyebrow x1  Actinic keratoses are precancerous spots that appear secondary to cumulative UV radiation exposure/sun exposure over time. They are chronic  with expected duration over 1 year. A portion of actinic keratoses will progress to squamous cell carcinoma of the skin. It is not possible to  reliably predict which spots will progress to skin cancer and so treatment is recommended to prevent development of skin cancer.  Recommend daily broad spectrum sunscreen SPF 30+ to sun-exposed areas, reapply every 2 hours as needed.  Recommend staying in the shade or wearing long sleeves, sun glasses (UVA+UVB protection) and wide brim hats (4-inch brim around the entire circumference of the hat). Call for new or changing lesions.  Destruction of lesion - Left Eyebrow x1 Complexity: simple   Destruction method: cryotherapy   Informed consent: discussed and consent obtained   Timeout:  patient name, date of birth, surgical site, and procedure verified Lesion destroyed using liquid nitrogen: Yes   Region frozen until ice ball extended beyond lesion: Yes   Outcome: patient tolerated procedure well with no complications   Post-procedure details: wound care instructions given   Additional details:  Prior to procedure, discussed risks of blister formation, small wound, skin dyspigmentation, or rare scar following cryotherapy. Recommend Vaseline ointment to treated areas while healing.    Return in about 1 year (around 05/23/2024) for TBSE, HxAK.  I, Lawson Radar, CMA, am acting as scribe for Armida Sans, MD.   Documentation: I have reviewed the above documentation for accuracy and completeness, and I agree with the above.  Armida Sans, MD

## 2023-05-24 NOTE — Patient Instructions (Addendum)

## 2023-05-25 ENCOUNTER — Encounter: Payer: Self-pay | Admitting: Dermatology

## 2024-03-10 ENCOUNTER — Emergency Department

## 2024-03-10 ENCOUNTER — Inpatient Hospital Stay
Admission: EM | Admit: 2024-03-10 | Discharge: 2024-03-13 | DRG: 418 | Disposition: A | Discharge status: Home / Self Care | Source: Ambulatory Visit | Attending: Internal Medicine | Admitting: Internal Medicine

## 2024-03-10 ENCOUNTER — Other Ambulatory Visit: Payer: Self-pay

## 2024-03-10 DIAGNOSIS — M109 Gout, unspecified: Secondary | ICD-10-CM | POA: Diagnosis present

## 2024-03-10 DIAGNOSIS — E66811 Obesity, class 1: Secondary | ICD-10-CM | POA: Diagnosis present

## 2024-03-10 DIAGNOSIS — E669 Obesity, unspecified: Secondary | ICD-10-CM | POA: Diagnosis present

## 2024-03-10 DIAGNOSIS — Z79899 Other long term (current) drug therapy: Secondary | ICD-10-CM

## 2024-03-10 DIAGNOSIS — D472 Monoclonal gammopathy: Secondary | ICD-10-CM | POA: Diagnosis present

## 2024-03-10 DIAGNOSIS — F1010 Alcohol abuse, uncomplicated: Secondary | ICD-10-CM | POA: Diagnosis present

## 2024-03-10 DIAGNOSIS — K219 Gastro-esophageal reflux disease without esophagitis: Secondary | ICD-10-CM | POA: Diagnosis present

## 2024-03-10 DIAGNOSIS — K828 Other specified diseases of gallbladder: Secondary | ICD-10-CM

## 2024-03-10 DIAGNOSIS — Z6831 Body mass index (BMI) 31.0-31.9, adult: Secondary | ICD-10-CM | POA: Diagnosis not present

## 2024-03-10 DIAGNOSIS — N179 Acute kidney failure, unspecified: Secondary | ICD-10-CM | POA: Diagnosis present

## 2024-03-10 DIAGNOSIS — D72829 Elevated white blood cell count, unspecified: Secondary | ICD-10-CM

## 2024-03-10 DIAGNOSIS — I10 Essential (primary) hypertension: Secondary | ICD-10-CM | POA: Diagnosis present

## 2024-03-10 DIAGNOSIS — K8000 Calculus of gallbladder with acute cholecystitis without obstruction: Principal | ICD-10-CM | POA: Diagnosis present

## 2024-03-10 DIAGNOSIS — F1020 Alcohol dependence, uncomplicated: Secondary | ICD-10-CM | POA: Diagnosis present

## 2024-03-10 DIAGNOSIS — E7800 Pure hypercholesterolemia, unspecified: Secondary | ICD-10-CM | POA: Diagnosis present

## 2024-03-10 DIAGNOSIS — R1011 Right upper quadrant pain: Principal | ICD-10-CM | POA: Diagnosis present

## 2024-03-10 DIAGNOSIS — F1090 Alcohol use, unspecified, uncomplicated: Secondary | ICD-10-CM | POA: Diagnosis present

## 2024-03-10 LAB — COMPREHENSIVE METABOLIC PANEL WITH GFR
ALT: 11 U/L (ref 0–44)
AST: 19 U/L (ref 15–41)
Albumin: 3.5 g/dL (ref 3.5–5.0)
Alkaline Phosphatase: 58 U/L (ref 38–126)
Anion gap: 10 (ref 5–15)
BUN: 16 mg/dL (ref 8–23)
CO2: 23 mmol/L (ref 22–32)
Calcium: 8.9 mg/dL (ref 8.9–10.3)
Chloride: 99 mmol/L (ref 98–111)
Creatinine, Ser: 1.42 mg/dL — ABNORMAL HIGH (ref 0.61–1.24)
GFR, Estimated: 53 mL/min — ABNORMAL LOW (ref >60)
Glucose, Bld: 148 mg/dL — ABNORMAL HIGH (ref 70–99)
Potassium: 3.8 mmol/L (ref 3.5–5.1)
Sodium: 132 mmol/L — ABNORMAL LOW (ref 135–145)
Total Bilirubin: 1.4 mg/dL — ABNORMAL HIGH (ref 0.0–1.2)
Total Protein: 7.2 g/dL (ref 6.5–8.1)

## 2024-03-10 LAB — URINALYSIS, ROUTINE W REFLEX MICROSCOPIC
Bacteria, UA: NONE SEEN
Bilirubin Urine: NEGATIVE
Glucose, UA: NEGATIVE mg/dL
Hgb urine dipstick: NEGATIVE
Ketones, ur: 20 mg/dL — AB
Leukocytes,Ua: NEGATIVE
Nitrite: NEGATIVE
Protein, ur: 100 mg/dL — AB
Specific Gravity, Urine: 1.027 (ref 1.005–1.030)
pH: 5 (ref 5.0–8.0)

## 2024-03-10 LAB — ETHANOL: Alcohol, Ethyl (B): <15 mg/dL (ref <15)

## 2024-03-10 LAB — MAGNESIUM: Magnesium: 2.2 mg/dL (ref 1.7–2.4)

## 2024-03-10 LAB — CBC
HCT: 37.9 % — ABNORMAL LOW (ref 39.0–52.0)
Hemoglobin: 13 g/dL (ref 13.0–17.0)
MCH: 33.1 pg (ref 26.0–34.0)
MCHC: 34.3 g/dL (ref 30.0–36.0)
MCV: 96.4 fL (ref 80.0–100.0)
Platelets: 265 K/uL (ref 150–400)
RBC: 3.93 MIL/uL — ABNORMAL LOW (ref 4.22–5.81)
RDW: 14.7 % (ref 11.5–15.5)
WBC: 18.4 K/uL — ABNORMAL HIGH (ref 4.0–10.5)
nRBC: 0 % (ref 0.0–0.2)

## 2024-03-10 LAB — LIPASE, BLOOD: Lipase: 24 U/L (ref 11–51)

## 2024-03-10 MED ORDER — CHLORDIAZEPOXIDE HCL 25 MG PO CAPS
25.0000 mg | ORAL_CAPSULE | Freq: Once | ORAL | Status: AC
  Administered 2024-03-10: 25 mg via ORAL
  Filled 2024-03-10: qty 1

## 2024-03-10 MED ORDER — PRAVASTATIN SODIUM 20 MG PO TABS
40.0000 mg | ORAL_TABLET | Freq: Every day | ORAL | Status: DC
  Administered 2024-03-11 – 2024-03-12 (×2): 40 mg via ORAL
  Filled 2024-03-10 (×2): qty 2

## 2024-03-10 MED ORDER — LORAZEPAM 2 MG/ML IJ SOLN: 0.0000 mg | Freq: Four times a day (QID) | INTRAMUSCULAR | Status: AC

## 2024-03-10 MED ORDER — LORAZEPAM 2 MG/ML IJ SOLN: 0.0000 mg | Freq: Two times a day (BID) | INTRAMUSCULAR | Status: DC

## 2024-03-10 MED ORDER — PANTOPRAZOLE SODIUM 40 MG PO TBEC
40.0000 mg | DELAYED_RELEASE_TABLET | Freq: Every day | ORAL | Status: DC
  Administered 2024-03-12 – 2024-03-13 (×2): 40 mg via ORAL
  Filled 2024-03-10 (×2): qty 1

## 2024-03-10 MED ORDER — IOHEXOL 300 MG/ML  SOLN
100.0000 mL | Freq: Once | INTRAMUSCULAR | Status: AC | PRN
  Administered 2024-03-10: 100 mL via INTRAVENOUS

## 2024-03-10 MED ORDER — ALBUTEROL SULFATE HFA 108 (90 BASE) MCG/ACT IN AERS: 2.0000 | INHALATION_SPRAY | RESPIRATORY_TRACT | Status: DC | PRN

## 2024-03-10 MED ORDER — ALBUTEROL SULFATE (2.5 MG/3ML) 0.083% IN NEBU: 2.5000 mg | INHALATION_SOLUTION | RESPIRATORY_TRACT | Status: DC | PRN

## 2024-03-10 MED ORDER — SODIUM CHLORIDE 0.9 % IV SOLN: INTRAVENOUS | Status: DC

## 2024-03-10 MED ORDER — HYDRALAZINE HCL 20 MG/ML IJ SOLN: 5.0000 mg | INTRAMUSCULAR | Status: DC | PRN

## 2024-03-10 MED ORDER — METOPROLOL SUCCINATE ER 25 MG PO TB24
25.0000 mg | ORAL_TABLET | Freq: Every day | ORAL | Status: DC
  Administered 2024-03-12 – 2024-03-13 (×2): 25 mg via ORAL
  Filled 2024-03-10 (×2): qty 1

## 2024-03-10 MED ORDER — OXYCODONE HCL 5 MG PO TABS
5.0000 mg | ORAL_TABLET | Freq: Four times a day (QID) | ORAL | Status: DC | PRN
  Administered 2024-03-11 – 2024-03-12 (×4): 5 mg via ORAL
  Filled 2024-03-10 (×4): qty 1

## 2024-03-10 MED ORDER — ADULT MULTIVITAMIN W/MINERALS CH
1.0000 | ORAL_TABLET | Freq: Every day | ORAL | Status: DC
  Administered 2024-03-12 – 2024-03-13 (×2): 1 via ORAL
  Filled 2024-03-10 (×2): qty 1

## 2024-03-10 MED ORDER — THIAMINE MONONITRATE 100 MG PO TABS
100.0000 mg | ORAL_TABLET | Freq: Every day | ORAL | Status: DC
  Administered 2024-03-12 – 2024-03-13 (×2): 100 mg via ORAL
  Filled 2024-03-10 (×2): qty 1

## 2024-03-10 MED ORDER — LORAZEPAM 2 MG/ML IJ SOLN: 1.0000 mg | INTRAMUSCULAR | Status: DC | PRN

## 2024-03-10 MED ORDER — LORAZEPAM 1 MG PO TABS: 1.0000 mg | ORAL_TABLET | ORAL | Status: DC | PRN

## 2024-03-10 MED ORDER — MORPHINE SULFATE (PF) 2 MG/ML IV SOLN
2.0000 mg | INTRAVENOUS | Status: DC | PRN
  Administered 2024-03-12 – 2024-03-13 (×2): 2 mg via INTRAVENOUS
  Filled 2024-03-10 (×2): qty 1

## 2024-03-10 MED ORDER — FOLIC ACID 1 MG PO TABS
1.0000 mg | ORAL_TABLET | Freq: Every day | ORAL | Status: DC
  Administered 2024-03-12 – 2024-03-13 (×2): 1 mg via ORAL
  Filled 2024-03-10 (×2): qty 1

## 2024-03-10 MED ORDER — THIAMINE HCL 100 MG/ML IJ SOLN: 100.0000 mg | Freq: Every day | INTRAMUSCULAR | Status: DC

## 2024-03-10 MED ORDER — PIPERACILLIN-TAZOBACTAM 3.375 G IVPB
3.3750 g | Freq: Three times a day (TID) | INTRAVENOUS | Status: DC
  Administered 2024-03-11 – 2024-03-13 (×6): 3.375 g via INTRAVENOUS
  Filled 2024-03-10 (×6): qty 50

## 2024-03-10 MED ORDER — PIPERACILLIN-TAZOBACTAM 4.5 G IVPB
4.5000 g | Freq: Once | INTRAVENOUS | Status: AC
  Administered 2024-03-10: 4.5 g via INTRAVENOUS
  Filled 2024-03-10: qty 100

## 2024-03-10 MED ORDER — ONDANSETRON HCL 4 MG/2ML IJ SOLN
4.0000 mg | Freq: Three times a day (TID) | INTRAMUSCULAR | Status: DC | PRN
  Administered 2024-03-11 (×2): 4 mg via INTRAVENOUS

## 2024-03-10 MED ORDER — ACETAMINOPHEN 325 MG PO TABS: 650.0000 mg | ORAL_TABLET | Freq: Four times a day (QID) | ORAL | Status: DC | PRN

## 2024-03-10 MED ORDER — SODIUM CHLORIDE 0.9 % IV BOLUS
1000.0000 mL | Freq: Once | INTRAVENOUS | Status: AC
  Administered 2024-03-10: 1000 mL via INTRAVENOUS

## 2024-03-10 NOTE — H&P (Signed)
 History and Physical    LAZAR TIERCE FMW:[id number] DOB: 08-14-1951 DOA: 03/10/2024  Referring MD/NP/PA:   PCP: [name], NP   Patient coming from:  The patient is coming from home.     Chief Complaint: Right upper quadrant abdominal pain, fever  HPI: Eric Henderson is a 73 y.o. male with medical history significant of hypertension, hyperlipidemia, gout, GERD, alcohol abuse, MGUS, chronic elevation of the bilirubin, obesity, who presents with right upper quadrant abdominal pain and fever.  Patient states that he has right upper quadrant abdominal pain in the past 4 days, which is constant, aching, moderate, nonradiating.  Not associated with nausea vomiting or diarrhea.  Not aggravated or alleviated by any known factors.  Patient had fever of 103 at home.  His temperature is 97.8 in ED.  No chest pain, cough, SOB.  No symptoms of UTI.  Data reviewed independently and ED Course: pt was found to have WBC 18.4, liver function (ALP 58, AST 19, ALT 11, total bilirubin 1.4), AKI with creatinine 1.42, BUN 60 and GFR 53 (recent baseline creatinine 0.9), temperature normal, blood pressure 117/70, heart rate 101 --> 69, RR 22, oxygen saturation 98% on room air.  Patient is admitted to MedSurg bed as inpatient.  Dr. Cesar of surgeries consulted.  CT abdomen/pelvis: 1. Slightly distended gallbladder with mild pericholecystic stranding. No calcified stone is seen. CT appearance is suspicious for acute cholecystitis but this was not confirmed on preceding ultrasound, if further imaging is required, correlation with nuclear medicine hepatobiliary imaging could be obtained. 2. Left kidney stones. Diverticular disease of the left colon without acute inflammatory process. 3. Aortic atherosclerosis. 4. Hepatobiliary: No focal hepatic abnormality or biliary dilatation. Slightly distended gallbladder. No calcified stone. Mild pericholecystic stranding   Aortic Atherosclerosis  (ICD10-I70.0).    US -RUQ: 1. Cholelithiasis and gallbladder sludge without evidence of acute cholecystitis.    EKG: I have personally reviewed.  Sinus rhythm, QTc 477, LAE, LAD, bifascicular block, poor R wave progression, PAC.   Review of Systems:   General: has fevers, chills, no body weight gain, fatigue HEENT: no blurry vision, hearing changes or sore throat Respiratory: no dyspnea, coughing, wheezing CV: no chest pain, no palpitations GI: no nausea, vomiting, has abdominal pain, no diarrhea, constipation GU: no dysuria, burning on urination, increased urinary frequency, hematuria  Ext: no leg edema Neuro: no unilateral weakness, numbness, or tingling, no vision change or hearing loss Skin: no rash, no skin tear. MSK: No muscle spasm, no deformity, no limitation of range of movement in spin Heme: No easy bruising.  Travel history: No recent long distant travel.   Allergy: No Known Allergies  Past Medical History:  Diagnosis Date   Dental bridge present    upper   GERD (gastroesophageal reflux disease)    Gout    Hypercholesteremia    Hypertension    Motion sickness    swings    Past Surgical History:  Procedure Laterality Date   COLONOSCOPY WITH ESOPHAGOGASTRODUODENOSCOPY (EGD)  09/28/2016   KNEE ARTHROSCOPY WITH MEDIAL MENISECTOMY Left 08/29/2019   Procedure: LEFT KNEE ARTHROSCOPIC PARTIAL MEDIAL MENISCECTOMY;  Surgeon: Tobie Priest, MD;  Location: [facility];  Service: Orthopedics;  Laterality: Left;    Social History:  reports that he has never smoked. He has never used smokeless tobacco. He reports current alcohol use of about 70.0 standard drinks of alcohol per week. He reports that he does not use drugs.  Family History: History reviewed. No pertinent family history.  Prior to Admission medications   Medication Sig Start Date End Date Taking? Authorizing Provider  azelastine (ASTELIN) 0.1 % nasal spray Place 1 spray into the nose 2 (two) times  daily. 02/21/24 03/22/24 Yes [provider]  colchicine 0.6 MG tablet Take 0.6-1.2 mg by mouth as needed. Take 2 tablets (1.2mg ) by mouth at first sign of gout flare followed by 1 tablet (0.6mg ) after 1 hour. (Max 1.8mg  within 1 hour) 02/07/23  Yes [provider]  fluticasone (FLONASE) 50 MCG/ACT nasal spray Place 1 spray into the nose daily. 10/01/23 09/30/24 Yes [provider]  ipratropium (ATROVENT) 0.06 % nasal spray Place 2 sprays into the nose 3 (three) times daily as needed for rhinitis. 10/01/23 09/30/24 Yes [provider]  lisinopril (ZESTRIL) 20 MG tablet Take 20 mg by mouth daily. FOR BLOOD PRESSURE 10/08/23  Yes [provider]  lovastatin (MEVACOR) 40 MG tablet Take 40 mg by mouth 2 (two) times daily. FOR CHOLESTEROL. Needs to schedule follow-up visit with PCP. 11/23/23  Yes [provider]  meloxicam (MOBIC) 7.5 MG tablet Take 7.5 mg by mouth daily as needed for pain. 12/13/23  Yes [provider]  metoprolol  succinate (TOPROL -XL) 25 MG 24 hr tablet Take 25 mg by mouth daily.  04/05/18  Yes [provider]  omeprazole (PRILOSEC) 40 MG capsule TAKE 1 CAPSULE (40 MG TOTAL) BY MOUTH ONCE DAILY PATIENT NEEDS AN APPOINTMENT FOR FURTHER REFILLS. 03/11/18  Yes [provider]  Omega-3 Fatty Acids (FISH OIL) 1000 MG CAPS Take 1 capsule by mouth daily.     [provider]  vitamin B-12 (CYANOCOBALAMIN) 1000 MCG tablet Take 1,000 mcg by mouth daily.     [provider]    Physical Exam: Vitals:   03/10/24 1543 03/10/24 1544 03/10/24 2045 03/11/24 0000  BP:  (!) 138/104 117/70 99/63  Pulse:  (!) 107 69 77  Resp:  20 18 16   Temp:  98.2 F (36.8 C) 97.8 F (36.6 C) 97.8 F (36.6 C)  TempSrc:  Oral Oral Oral  SpO2:  98% 100% 99%  Weight: 91.6 kg     Height: 5' 8 (1.727 m)      General: Not in acute distress HEENT:       Eyes: PERRL, EOMI, no jaundice       ENT: No discharge from the ears and nose,  no pharynx injection, no tonsillar enlargement.        Neck: No JVD, no bruit, no mass felt. Heme: No neck lymph node enlargement. Cardiac: S1/S2, RRR, No murmurs, No gallops or rubs. Respiratory: No rales, wheezing, rhonchi or rubs. GI: Soft, nondistended, nontender, no rebound pain, no organomegaly, BS present. GU: No hematuria Ext: No pitting leg edema bilaterally. 1+DP/PT pulse bilaterally. Musculoskeletal: No joint deformities, No joint redness or warmth, no limitation of ROM in spin. Skin: No rashes.  Neuro: Alert, oriented X3, cranial nerves II-XII grossly intact, moves all extremities normally. Muscle strength 5/5 in all extremities, sensation to light touch intact. Brachial reflex 2+ bilaterally. Knee reflex 1+ bilaterally. Negative Babinski's sign. Normal finger to nose test. Psych: Patient is not psychotic, no suicidal or hemocidal ideation.  Labs on Admission: I have personally reviewed following labs and imaging studies  CBC: Recent Labs  Lab 03/10/24 1556  WBC 18.4*  HGB 13.0  HCT 37.9*  MCV 96.4  PLT 265   Basic Metabolic Panel: Recent Labs  Lab 03/10/24 1556  NA 132*  K 3.8  CL 99  CO2 23  GLUCOSE 148*  BUN 16  CREATININE 1.42*  CALCIUM  8.9  MG 2.2   GFR: Estimated Creatinine Clearance: 52.4 mL/min (A) (by C-G formula based on SCr of 1.42 mg/dL (H)). Liver Function Tests: Recent Labs  Lab 03/10/24 1556  AST 19  ALT 11  ALKPHOS 58  BILITOT 1.4*  PROT 7.2  ALBUMIN  3.5   Recent Labs  Lab 03/10/24 1556  LIPASE 24   No results for input(s): AMMONIA in the last 168 hours. Coagulation Profile: Recent Labs  Lab 03/10/24 2333  INR 1.1   Cardiac Enzymes: No results for input(s): CKTOTAL, CKMB, CKMBINDEX, TROPONINI in the last 168 hours. BNP (last 3 results) No results for input(s): PROBNP in the last 8760 hours. HbA1C: No results for input(s): HGBA1C in the last 72 hours. CBG: No results for input(s): GLUCAP in the last 168  hours. Lipid Profile: No results for input(s): CHOL, HDL, LDLCALC, TRIG, CHOLHDL, LDLDIRECT in the last 72 hours. Thyroid Function Tests: No results for input(s): TSH, T4TOTAL, FREET4, T3FREE, THYROIDAB in the last 72 hours. Anemia Panel: No results for input(s): VITAMINB12, FOLATE, FERRITIN, TIBC, IRON, RETICCTPCT in the last 72 hours. Urine analysis:    Component Value Date/Time   COLORURINE AMBER (A) 03/10/2024 1556   APPEARANCEUR HAZY (A) 03/10/2024 1556   LABSPEC 1.027 03/10/2024 1556   PHURINE 5.0 03/10/2024 1556   GLUCOSEU NEGATIVE 03/10/2024 1556   HGBUR NEGATIVE 03/10/2024 1556   BILIRUBINUR NEGATIVE 03/10/2024 1556   KETONESUR 20 (A) 03/10/2024 1556   PROTEINUR 100 (A) 03/10/2024 1556   NITRITE NEGATIVE 03/10/2024 1556   LEUKOCYTESUR NEGATIVE 03/10/2024 1556   Sepsis Labs: @LABRCNTIP (procalcitonin:4,lacticidven:4) )No results found for this or any previous visit (from the past 240 hours).   Radiological Exams on Admission:   Assessment/Plan Principal Problem:   Right upper quadrant abdominal pain Active Problems:   Hypertension   Hypercholesteremia   Gout   AKI (acute kidney injury) (HCC)   Alcohol abuse   Obesity (BMI 30-39.9)   Assessment and Plan:   Right upper quadrant abdominal pain: CT scan findings are suspicious for acute cholecystitis.  Right upper quadrant abdominal ultrasound showed cholelithiasis and gallbladder sludge without evidence of acute cholecystitis. EDP consulted Dr. Cesar of surgery, who recommended to get HIDA scan.  Patient has WBC 18.4, but no fever in ED.  Clinically does not seem to have sepsis.  - Admitted to MedSurg bed as inpatient - IV Zosyn  - Follow-up blood culture - Pain control: As needed morphine , oxycodone , Tylenol  - As needed Zofran  - IV fluid: 2 L normal saline, Naima 75 cc/h - Follow-up HIDA scan  Hypertension -IV hydralazine  as needed - Hold lisinopril due to AKI - Continue  metoprolol   Hypercholesteremia -Switch lovastatin to pravastatin  in hospital  AKI (acute kidney injury) (HCC): Likely due to dehydration continuation of lisinopril -Hold lisinopril - IV fluid as above  Alcohol abuse: Currently no signs of withdrawal yet. -Give 25 mg of LIbrium  - CIWA protocol - Due to counseling about importance of cutting down alcohol drinking  Obesity (BMI 30-39.9): Patient has Obesity Class I, with body weight 91.6 Kg and BMI  30.71kg/m2.  - Encourage losing weight - Exercise and healthy diet    DVT ppx: SCD  Code Status: Full code   Family Communication:  Yes, patient's  wife  at bed side.     Disposition Plan:  Anticipate discharge back to previous environment  Consults called:  Dr. Cesar of surgeries consulted.  Admission status  and Level of care: as inpt        Dispo: The patient is from: Home              Anticipated d/c is to: Home              Anticipated d/c date is: 2 days              Patient currently is not medically stable to d/c.    Severity of Illness:  The appropriate patient status for this patient is INPATIENT. Inpatient status is judged to be reasonable and necessary in order to provide the required intensity of service to ensure the patient's safety. The patient's presenting symptoms, physical exam findings, and initial radiographic and laboratory data in the context of their chronic comorbidities is felt to place them at high risk for further clinical deterioration. Furthermore, it is not anticipated that the patient will be medically stable for discharge from the hospital within 2 midnights of admission.   * I certify that at the point of admission it is my clinical judgment that the patient will require inpatient hospital care spanning beyond 2 midnights from the point of admission due to high intensity of service, high risk for further deterioration and high frequency of surveillance required.*       Date of Service  03/11/2024    Caleb Exon [facility]   If 7PM-7AM, please contact night-coverage [email or web address] 03/11/2024, 1:11 AM

## 2024-03-10 NOTE — ED Triage Notes (Signed)
 Pt presents with upper abd pain, decreased appetite, and fever with TMax of 103 that started 3 days ago. Denies N/V/D. Last took APAP around 1445.

## 2024-03-10 NOTE — ED Provider Notes (Signed)
 [facility] Provider Note    Event Date/Time   First MD Initiated Contact with Patient 03/10/24 2039     (approximate)   History   Abdominal Pain   HPI  Eric Henderson is a 73 y.o. male alcohol dependence, hypertension, gout, MGUS, chronically elevated bilirubin he presents to the emergency department with 4 days of worsening abdominal pain.  Pain initiated on Friday initially started in his right upper quadrant but has gone to the left upper quadrant.  He thought he was constipated as he had not had a bowel movement in several days.  He took mag citrate and has had loose stool since but it has not relieved the pain.  He last had a loose stool yesterday evening.  He states that he is still passing flatus.  He denies any chest pain or shortness of breath.  He has no penile or scrotal abnormalities.  He has no prior history of abdominal surgeries.  He last took alcohol on Friday.  He denies any history of withdrawal symptoms and states he can quit whenever he wants to.  He saw his primary care physician earlier today and was subsequently sent to the emergency department for further evaluation. he presents with his wife who contributes to history      Physical Exam   Triage Vital Signs: ED Triage Vitals  Encounter Vitals Group     BP 03/10/24 1544 (!) 138/104     Girls Systolic BP Percentile --      Girls Diastolic BP Percentile --      Boys Systolic BP Percentile --      Boys Diastolic BP Percentile --      Pulse Rate 03/10/24 1544 (!) 107     Resp 03/10/24 1544 20     Temp 03/10/24 1544 98.2 F (36.8 C)     Temp Source 03/10/24 1544 Oral     SpO2 03/10/24 1544 98 %     Weight 03/10/24 1543 202 lb (91.6 kg)     Height 03/10/24 1543 5' 8 (1.727 m)     Head Circumference --      Peak Flow --      Pain Score 03/10/24 1543 5     Pain Loc --      Pain Education --      Exclude from Growth Chart --     Most recent vital signs: Vitals:   03/10/24  1544 03/10/24 2045  BP: (!) 138/104 117/70  Pulse: (!) 107 69  Resp: 20 18  Temp: 98.2 F (36.8 C) 97.8 F (36.6 C)  SpO2: 98% 100%    Nursing Triage Note reviewed. Vital signs reviewed and patients oxygen saturation is normoxic  General: Patient is well nourished, well developed, awake and alert, resting comfortably in no acute distress Head: Normocephalic and atraumatic Eyes: Normal inspection, extraocular muscles intact, no conjunctival pallor Ear, nose, throat: Normal external exam Neck: Normal range of motion Respiratory: Patient is in no respiratory distress, lungs CTAB Cardiovascular: Patient is not tachycardic, RRR without murmur appreciated GI: Abd soft, mildly tender in the right upper quadrant and left upper quadrant with no rebound or guarding Back: Normal inspection of the back with good strength and range of motion throughout all ext Extremities: pulses intact with good cap refills, no LE pitting edema or calf tenderness Neuro: The patient is alert and oriented to person, place, and time, appropriately conversive, with 5/5 bilat UE/LE strength, no gross motor or sensory defects  noted. Coordination appears to be adequate. Skin: Warm, dry, and intact Psych: normal mood and affect, no SI or HI  ED Results / Procedures / Treatments   Labs (all labs ordered are listed, but only abnormal results are displayed) Labs Reviewed  COMPREHENSIVE METABOLIC PANEL WITH GFR - Abnormal; Notable for the following components:      Result Value   Sodium 132 (*)    Glucose, Bld 148 (*)    Creatinine, Ser 1.42 (*)    Total Bilirubin 1.4 (*)    GFR, Estimated 53 (*)    All other components within normal limits  CBC - Abnormal; Notable for the following components:   WBC 18.4 (*)    RBC 3.93 (*)    HCT 37.9 (*)    All other components within normal limits  URINALYSIS, ROUTINE W REFLEX MICROSCOPIC - Abnormal; Notable for the following components:   Color, Urine AMBER (*)     APPearance HAZY (*)    Ketones, ur 20 (*)    Protein, ur 100 (*)    All other components within normal limits  CULTURE, BLOOD (ROUTINE X 2)  CULTURE, BLOOD (ROUTINE X 2)  LIPASE, BLOOD  ETHANOL  MAGNESIUM  COMPREHENSIVE METABOLIC PANEL WITH GFR  CBC  PROTIME-INR  APTT     EKG EKG and rhythm strip are interpreted by myself:   EKG: [Normal sinus rhythm] at heart rate of 94, normal QRS duration, QTc 477, nonspecific ST segments and T waves no ectopy EKG not consistent with Acute STEMI Rhythm strip: NSR in lead II   RADIOLOGY CT abd and pelvis with iv contrast: No evidence of small bowel obstruction on my independent review interpretation radiologist agrees but also states that though may be evidence of acute cholecystitis  Right upper quadrant ultrasound: Read as biliary sludge but no evidence of cholecystitis   PROCEDURES:  Critical Care performed: No  Procedures   MEDICATIONS ORDERED IN ED: Medications  piperacillin -tazobactam (ZOSYN ) IVPB 4.5 g (4.5 g Intravenous New Bag/Given 03/10/24 2341)  0.9 %  sodium chloride  infusion ( Intravenous New Bag/Given 03/10/24 2341)  ondansetron  (ZOFRAN ) injection 4 mg (has no administration in time range)  hydrALAZINE  (APRESOLINE ) injection 5 mg (has no administration in time range)  acetaminophen  (TYLENOL ) tablet 650 mg (has no administration in time range)  morphine  (PF) 2 MG/ML injection 2 mg (has no administration in time range)  oxyCODONE  (Oxy IR/ROXICODONE ) immediate release tablet 5 mg (has no administration in time range)  LORazepam  (ATIVAN ) tablet 1-4 mg (has no administration in time range)    Or  LORazepam  (ATIVAN ) injection 1-4 mg (has no administration in time range)  thiamine  (VITAMIN B1) tablet 100 mg (has no administration in time range)    Or  thiamine  (VITAMIN B1) injection 100 mg (has no administration in time range)  folic acid  (FOLVITE ) tablet 1 mg (has no administration in time range)  multivitamin with  minerals tablet 1 tablet (has no administration in time range)  LORazepam  (ATIVAN ) injection 0-4 mg (0 mg Intravenous Not Given 03/10/24 2330)    Followed by  LORazepam  (ATIVAN ) injection 0-4 mg (has no administration in time range)  pravastatin  (PRAVACHOL ) tablet 40 mg (has no administration in time range)  metoprolol  succinate (TOPROL -XL) 24 hr tablet 25 mg (has no administration in time range)  pantoprazole  (PROTONIX ) EC tablet 40 mg (has no administration in time range)  albuterol  (PROVENTIL ) (2.5 MG/3ML) 0.083% nebulizer solution 2.5 mg (has no administration in time range)  piperacillin -tazobactam (ZOSYN ) IVPB  3.375 g (has no administration in time range)  sodium chloride  0.9 % bolus 1,000 mL (0 mLs Intravenous Stopped 03/10/24 2241)  iohexol  (OMNIPAQUE ) 300 MG/ML solution 100 mL (100 mLs Intravenous Contrast Given 03/10/24 2200)  chlordiazePOXIDE  (LIBRIUM ) capsule 25 mg (25 mg Oral Given 03/10/24 01-Apr-2340)     IMPRESSION / MDM / ASSESSMENT AND PLAN / ED COURSE                                Differential diagnosis includes, but is not limited to, colitis, pancreatitis, cholecystitis, small bowel obstruction, electrolyte derangement   Patient presents without evidence of any peritonitis.  He is borderline tachycardic with a leukocytosis albeit very well-appearing on exam.  Given his borderline tachycardia we will give him some fluids.  He does have a slightly elevated bilirubin although reportedly this is always elevated.  Given his history of ongoing drinking we will obtain both a CT abdomen pelvis to evaluate for any potential colitis small bowel obstruction and a right upper quadrant ultrasound to evaluate for the possibility of acute cholecystitis.  He does not appear to be in any alcohol withdrawal at this moment  11:47 PM on 03/10/2024: Given inconclusive nature of possible cholecystitis Case was discussed with general surgery consultant Dr. Cesar who recommends hospitalist admission  for HIDA scan. Given patient's leukocytosis and possible cholecystitis I did make the decision to ordered a dose of Zosyn .  Case discussed with hospitalist who would like blood cultures obtained prior to Zosyn  which has been ordered.  Plan for HIDA scan in the morning   Clinical Course as of 03/10/24 2347  Mon Mar 10, 2024  2038 WBC(!): 18.4 Significant wbc [HD]  04-01-38 Total Bilirubin(!): 1.4 Slightly more than normal but no other abnormalities [HD]  2117-04-01 Urinalysis, Routine w reflex microscopic -Urine, Clean Catch(!) Not consistent with UTI [HD]  2229/04/01 Case discussed with general surgereon and he recommends admitting to the hospitalist and obtaining a HIDA scan.  Will update patient with plan [HD]    Clinical Course User Index [HD] Nicholaus Rolland BRAVO, MD   Data(2/3 categories following were performed): I reviewed or ordered at least three unique tests, external notes, and/or the history required an independent historian as one of the three requirements as following: At least 3 labs/imaging studies were obtained and/or reviewed. AND  I discussed the management of the patient with the following external physician or qualified healthcare provider: Admitting physician  Risk: This patient has a high risk of morbidity due to further diagnostic testing or treatment. Rationale: Decision made regarding admission  Admit Level 5 - Labs/Rads, Admit, Consult: General Surgery: Dr. Cesar  Suggested E/M Coding Level: 5, [phone]  This level has been selected based on the 04/01/2022 CPT guidelines for E/M codes in the Emergency Department based on 2/3 of the CoPA, Data, and Risk.   FINAL CLINICAL IMPRESSION(S) / ED DIAGNOSES   Final diagnoses:  RUQ pain  Gallbladder sludge  Leukocytosis, unspecified type     Rx / DC Orders   ED Discharge Orders     None        Note:  This document was prepared using Dragon voice recognition software and may include unintentional dictation errors.   Nicholaus Rolland BRAVO, MD 03/10/24 [phone]

## 2024-03-10 NOTE — ED Notes (Signed)
 ED Provider at bedside.

## 2024-03-10 NOTE — Progress Notes (Signed)
 Eric Henderson is a 73 y.o. male that comes today for the following problem(s):  No chief complaint on file.   HPI: Patient in the office with complaint of abdominal pain since Friday.  He suspected this was a reoccurrence of constipation that he will usually get a couple of times a year so he took mag citrate on Saturday and Sunday.  He had a large number of watery stools but reports no significant improvement in his abdominal discomfort.  He feels like his abdomen is tense.  He notes it is very tender.  He denies nausea or vomiting.  He has noted a fever.  He denies congestion, drainage, sore throat, cough or wheezing.  He denies chest pain or palpitations.  He has not been eating or drinking much.  He does drink alcohol on a regular basis, usually at least 6/day.  [place] but denies drinking more than usual.   Goals     .  Lose Weight      Daughter is getting married in November 2018 and would like to cut back on the fried food, cheeseburgers and alcohol.     . * Lose Weight (pt-stated)      Would like to lose another 10 pounds by eating better    . * Maintain health/healthy lifestyle (pt-stated)      Patient is just taking it day to day         Patient Active Problem List  Diagnosis  . HTN (hypertension)  . Mixed hyperlipidemia  . Alcohol Use, prior heavy now moderate  . GERD (gastroesophageal reflux disease)  . Sinus tachycardia  . Prediabetes  . B12 deficiency  . Gout  . High risk medication use  . MGUS (monoclonal gammopathy of unknown significance)  . Hallux rigidus of both feet  . Severe obesity (BMI 35.0-39.9) with comorbidity (CMS/HHS-HCC)  . Elevated bilirubin  . Vitamin D deficiency  . Chronic left shoulder pain  . Osteopenia of multiple sites  . Ventral hernia without obstruction or gangrene  . Rhinorrhea     Past Medical History:  Diagnosis Date  . Acute idiopathic gout involving toe of right foot   . Alcohol Use, prior heavy  now moderate 01/23/2013   Down to 3-4 drinks a day from historical reports of 10 drinks a day.   . B12 deficiency 07/09/2017   B12 level 266 on 07/05/2017; started on oral B12 once daily  . Compliance Issues: h/o No Shows 01/23/2013  . Dysphagia 09/12/2016  . GERD (gastroesophageal reflux disease) 09/12/2016  . Hallux rigidus of both feet 09/17/2019   severe end-stage arthritic changes of both the right and left great toe joints per Dr. Ashley  . Hypertension   . Impaired glucose tolerance 01/09/2017  . Leg pain, bilateral, ? sciatica 04/18/2013  . MGUS (monoclonal gammopathy of unknown significance) 04/23/2018   Last Assessment & Plan:  #M protein 0.2 g/dL; incidental-mild anemia; no evidence of any other organ dysfunction or neuropathy.   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 Patient is less likely at this time patient has any active myeloma-although given anemia [see discussion below]  # MILD ANEMIA- Hb 13 [N-1  . Mild chronic anemia 04/05/2018  . Mixed hyperlipidemia 01/23/2013  . Noncompliance with treatment plan 03/11/2019   Noncompliance with follow up appointments-- limit refills  . Tests: Myoview  2006,fixed defecit EF 59% 01/23/2013  Past Surgical History:  Procedure Laterality Date  . COLONOSCOPY  09/28/2016   Int Hemorrhoids, Diverticulosis: CBF 09/2026  . EGD  09/28/2016   Normal Stomach & Esophagus: No repeat per RTE  . ARTHROTOMY KNEE WITH MENISCECTOMY MEDIAL AND LATERAL Left 08/29/2019   Arthroscopic partial medial meniscectomy by Dr. Tobie    Social History   Socioeconomic History  . Marital status: Life Partner    Spouse name: Stable girlfriend  . Number of children: 2  Occupational History    Comment: [employer]  Tobacco Use  . Smoking status: Never  . Smokeless tobacco: Never  Vaping Use  . Vaping status: Never Used  Substance and Sexual Activity  . Alcohol use: Yes     Comment: 20 drinks of beer or vodka, down from 48 drinks per week  . Drug use: No  . Sexual activity: Yes    Partners: Female    Birth control/protection: None  Social History Narrative   Divorced. Current girlfriend lives with him. Two children in [place] and [place]. 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 Hobbies: Golf. No smokers in the home. Firearms in home are locked and secure. Smoke detector and carbon monoxide detector are up to date.    Social Drivers of Corporate investment banker Strain: Low Risk  (07/05/2017)   Overall Financial Resource Strain (CARDIA)   . Difficulty of Paying Living Expenses: Not hard at all  Food Insecurity: No Food Insecurity (07/05/2017)   Hunger Vital Sign   . Worried About Programme researcher, broadcasting/film/video in the Last Year: Never true   . Ran Out of Food in the Last Year: Never true  Transportation Needs: No Transportation Needs (07/05/2017)   PRAPARE - Transportation   . Lack of Transportation (Medical): No   . Lack of Transportation (Non-Medical): No  Physical Activity: Sufficiently Active (07/05/2017)   Exercise Vital Sign   . Days of Exercise per Week: 7 days   . Minutes of Exercise per Session: 30 min  Stress: No Stress Concern Present (07/05/2017)   [employer] of Occupational Health - Occupational Stress Questionnaire   . Feeling of Stress : Not at all  Social Connections: Moderately Isolated (07/05/2017)   Social Connection and Isolation Panel   . Frequency of Communication with Friends and Family: More than three times a week   . Frequency of Social Gatherings with Friends and Family: More than three times a week   . Attends Religious Services: Never   . Active Member of Clubs or Organizations: No   . Attends Banker Meetings: Never   . Marital Status: Living with partner    Family History  Problem Relation Name Age of Onset  . COPD Mother    . Stroke Father  23       Brain Aneurysm  .  Alcohol abuse Father    . No Known Problems Sister    . COPD Brother    . No Known Problems Daughter    . No Known Problems Sister    . Prostate cancer Brother  60  . No Known Problems Brother    . No Known Problems Brother    . No Known Problems Brother    . No Known Problems Daughter    . Coronary Artery Disease (Blocked arteries around heart) Neg Hx    . Diabetes type II Neg Hx    . Cancer Neg Hx    . Colon  cancer Neg Hx    . Breast cancer Neg Hx       No Known Allergies  Prior to Admission medications  Medication Sig Taking? Last Dose  azelastine (ASTELIN) 137 mcg nasal spray PLACE 1 SPRAY INTO BOTH NOSTRILS 2 (TWO) TIMES DAILY AS NEEDED FOR RHINITIS FOR UP TO 30 DAYS Yes Taking  cholecalciferol (VITAMIN D3) 2,000 unit capsule Take 3 capsules daily for 3 months, then reduce to 1 capsule daily thereafter for Vitamin D Deficiency. Yes Taking  colchicine (COLCRYS) 0.6 mg tablet Take 2 tablets (1.2mg ) by mouth at first sign of gout flare followed by 1 tablet (0.6mg ) after 1 hour. (Max 1.8mg  within 1 hour) Yes PRN Not Currently Taking  cyanocobalamin (VITAMIN B12) 1000 MCG tablet Take 1,000 mcg by mouth once daily Yes Taking  fluticasone propionate (FLONASE) 50 mcg/actuation nasal spray Place 1 spray into both nostrils once daily Yes Taking  ipratropium (ATROVENT) 0.06 % nasal spray Place 2 sprays into both nostrils 3 (three) times daily as needed for Rhinitis Yes Taking  LACTOBACILLUS ACIDOPHILUS (ACIDOPHILUS ORAL) Take 1 capsule by mouth once daily   Yes Taking  lisinopriL (ZESTRIL) 20 MG tablet TAKE 1 TABLET (20 MG TOTAL) BY MOUTH ONCE DAILY FOR BLOOD PRESSURE Yes Taking  lovastatin (MEVACOR) 40 MG tablet Take 1 tablet (40 mg total) by mouth 2 (two) times daily for cholesterol. NEEDS TO SCHEDULE FOLLOW-UP VISIT WITH PCP Yes Taking  meloxicam (MOBIC) 7.5 MG tablet TAKE 1 TABLET (7.5 MG TOTAL) BY MOUTH ONCE DAILY AS NEEDED FOR PAIN Yes Taking  metoprolol  succinate (TOPROL -XL) 25 MG XL  tablet TAKE 1 TABLET (25 MG TOTAL) BY MOUTH ONCE DAILY FOR BLOOD PRESSURE Yes Taking  omeprazole (PRILOSEC) 40 MG DR capsule TAKE 1 CAPSULE (40 MG TOTAL) BY MOUTH ONCE DAILY FOR REFLUX Yes Taking  albuterol  MDI, PROVENTIL , VENTOLIN , PROAIR , HFA 90 mcg/actuation inhaler Inhale 2 inhalations into the lungs every 4 (four) hours as needed for Wheezing or Shortness of Breath for up to 90 days    predniSONE (DELTASONE) 20 MG tablet Take 2 tablets today, then 1 tablet daily for the next 4 days. Patient not taking: Reported on 03/10/2024  Not Taking     Objective:  BP 100/60 (BP Location: Left upper arm, Patient Position: Sitting, BP Cuff Size: Large Adult)   Pulse (!) 112   Ht 170.2 cm (5' 7)   Wt 96.2 kg (212 lb)   SpO2 96%   BMI 33.20 kg/m   Physical Examination:  GENERAL:  The patient is alert, oriented and in no acute distress.  HEENT:  Head is normocephalic/atraumatic.  Pupils equal, round and reactive to light and accommodation.  Extraocular movements intact.  Nose and throat are clear. NECK/LYMPHATICS:  Supple without thyromegaly or lymphadenopathy.   RESPIRATORY:  Chest wall is within normal limits.   Clear to auscultation and percussion.   CARDIAC:  Regular rate and rhythm, normal S1 and S2 without murmurs, rubs or gallops.   VASCULAR:  Distal pulses 2+.   GASTROINTESTINAL  Soft. No organomegaly.  Tenderness bilateral upper abdomen.  Positive bowel sounds x 4.  No rebound or rigidity.  Mild guarding. MUSCULOSKELETAL:  No cyanosis, clubbing or edema noted.   NEUROLOGIC:  The patient is alert and oriented x 3.  No focal deficits.       A/P   Diagnoses and all orders for this visit:  Abdominal pain, unspecified abdominal location - new  Depression screening (Z13.31) -     Depression Screen -(  PHQ- 2/9, BDI)  Patient directed to the emergency room due to concerns about acute pancreatitis and the need for advanced imaging and fluids.  The patient declines EMS and reports he will  be driven.  Return if symptoms worsen or fail to improve.     *Some images could not be shown.

## 2024-03-10 NOTE — ED Notes (Signed)
 Pt provided w/ sandwich and cola per request, ok per Nicholaus, MD

## 2024-03-11 ENCOUNTER — Encounter
Admission: EM | Disposition: A | Discharge status: Home / Self Care | Payer: Self-pay | Source: Ambulatory Visit | Attending: Internal Medicine

## 2024-03-11 ENCOUNTER — Encounter: Payer: Self-pay | Admitting: Internal Medicine

## 2024-03-11 ENCOUNTER — Other Ambulatory Visit: Payer: Self-pay

## 2024-03-11 ENCOUNTER — Inpatient Hospital Stay: Admitting: Certified Registered"

## 2024-03-11 DIAGNOSIS — R1011 Right upper quadrant pain: Secondary | ICD-10-CM | POA: Diagnosis not present

## 2024-03-11 HISTORY — PX: INDOCYANINE GREEN FLUORESCENCE IMAGING (ICG): SHX7595

## 2024-03-11 LAB — COMPREHENSIVE METABOLIC PANEL WITH GFR
ALT: 11 U/L (ref 0–44)
AST: 14 U/L — ABNORMAL LOW (ref 15–41)
Albumin: 2.9 g/dL — ABNORMAL LOW (ref 3.5–5.0)
Alkaline Phosphatase: 52 U/L (ref 38–126)
Anion gap: 8 (ref 5–15)
BUN: 15 mg/dL (ref 8–23)
CO2: 23 mmol/L (ref 22–32)
Calcium: 8.1 mg/dL — ABNORMAL LOW (ref 8.9–10.3)
Chloride: 103 mmol/L (ref 98–111)
Creatinine, Ser: 1.11 mg/dL (ref 0.61–1.24)
GFR, Estimated: >60 mL/min (ref >60)
Glucose, Bld: 112 mg/dL — ABNORMAL HIGH (ref 70–99)
Potassium: 4 mmol/L (ref 3.5–5.1)
Sodium: 134 mmol/L — ABNORMAL LOW (ref 135–145)
Total Bilirubin: 1.3 mg/dL — ABNORMAL HIGH (ref 0.0–1.2)
Total Protein: 6.1 g/dL — ABNORMAL LOW (ref 6.5–8.1)

## 2024-03-11 LAB — PROTIME-INR
INR: 1.1 (ref 0.8–1.2)
Prothrombin Time: 14.9 s (ref 11.4–15.2)

## 2024-03-11 LAB — CBC
HCT: 33.3 % — ABNORMAL LOW (ref 39.0–52.0)
Hemoglobin: 11.2 g/dL — ABNORMAL LOW (ref 13.0–17.0)
MCH: 32.3 pg (ref 26.0–34.0)
MCHC: 33.6 g/dL (ref 30.0–36.0)
MCV: 96 fL (ref 80.0–100.0)
Platelets: 219 K/uL (ref 150–400)
RBC: 3.47 MIL/uL — ABNORMAL LOW (ref 4.22–5.81)
RDW: 14.6 % (ref 11.5–15.5)
WBC: 12.9 K/uL — ABNORMAL HIGH (ref 4.0–10.5)
nRBC: 0 % (ref 0.0–0.2)

## 2024-03-11 LAB — APTT: aPTT: 38 s — ABNORMAL HIGH (ref 24–36)

## 2024-03-11 LAB — LACTIC ACID, PLASMA
Lactic Acid, Venous: 1.1 mmol/L (ref 0.5–1.9)
Lactic Acid, Venous: 1 mmol/L (ref 0.5–1.9)

## 2024-03-11 LAB — GLUCOSE, CAPILLARY: Glucose-Capillary: 107 mg/dL — ABNORMAL HIGH (ref 70–99)

## 2024-03-11 SURGERY — CHOLECYSTECTOMY, ROBOT-ASSISTED, LAPAROSCOPIC
Anesthesia: General

## 2024-03-11 MED ORDER — FENTANYL CITRATE (PF) 100 MCG/2ML IJ SOLN
INTRAMUSCULAR | Status: AC
  Filled 2024-03-11: qty 2

## 2024-03-11 MED ORDER — BUPIVACAINE-EPINEPHRINE 0.25% -1:200000 IJ SOLN
INTRAMUSCULAR | Status: DC | PRN
  Administered 2024-03-11: 30 mL

## 2024-03-11 MED ORDER — OXYCODONE HCL 5 MG PO TABS
5.0000 mg | ORAL_TABLET | Freq: Once | ORAL | Status: AC | PRN
  Administered 2024-03-11: 5 mg via ORAL

## 2024-03-11 MED ORDER — CEFAZOLIN SODIUM-DEXTROSE 1-4 GM/50ML-% IV SOLN
INTRAVENOUS | Status: DC | PRN
  Administered 2024-03-11: 2 g via INTRAVENOUS

## 2024-03-11 MED ORDER — ALBUMIN HUMAN 5 % IV SOLN
INTRAVENOUS | Status: AC
Start: 2024-03-11 — End: 2024-03-11
  Filled 2024-03-11: qty 750

## 2024-03-11 MED ORDER — ROCURONIUM BROMIDE 100 MG/10ML IV SOLN
INTRAVENOUS | Status: DC | PRN
  Administered 2024-03-11: 60 mg via INTRAVENOUS
  Administered 2024-03-11: 10 mg via INTRAVENOUS

## 2024-03-11 MED ORDER — OXYCODONE HCL 5 MG PO TABS
ORAL_TABLET | ORAL | Status: AC
  Filled 2024-03-11: qty 1

## 2024-03-11 MED ORDER — PHENYLEPHRINE HCL-NACL 20-0.9 MG/250ML-% IV SOLN
INTRAVENOUS | Status: DC | PRN
Start: 2024-03-11 — End: 2024-03-11
  Administered 2024-03-11: 25 ug/min via INTRAVENOUS

## 2024-03-11 MED ORDER — CALCIUM CHLORIDE 10 % IV SOLN
INTRAVENOUS | Status: AC
  Filled 2024-03-11: qty 20

## 2024-03-11 MED ORDER — SUGAMMADEX SODIUM 200 MG/2ML IV SOLN
INTRAVENOUS | Status: DC | PRN
  Administered 2024-03-11: 200 mg via INTRAVENOUS

## 2024-03-11 MED ORDER — DEXAMETHASONE SODIUM PHOSPHATE 10 MG/ML IJ SOLN
INTRAMUSCULAR | Status: DC | PRN
  Administered 2024-03-11: 10 mg via INTRAVENOUS

## 2024-03-11 MED ORDER — SUCCINYLCHOLINE CHLORIDE 200 MG/10ML IV SOSY
PREFILLED_SYRINGE | INTRAVENOUS | Status: DC | PRN
  Administered 2024-03-11: 60 mg via INTRAVENOUS

## 2024-03-11 MED ORDER — PROPOFOL 10 MG/ML IV BOLUS
INTRAVENOUS | Status: AC
  Filled 2024-03-11: qty 20

## 2024-03-11 MED ORDER — MIDAZOLAM HCL 2 MG/2ML IJ SOLN
INTRAMUSCULAR | Status: DC | PRN
  Administered 2024-03-11: 2 mg via INTRAVENOUS

## 2024-03-11 MED ORDER — OXYCODONE HCL 5 MG/5ML PO SOLN: 5.0000 mg | Freq: Once | ORAL | Status: AC | PRN

## 2024-03-11 MED ORDER — SODIUM CHLORIDE 0.9 % IV BOLUS
1000.0000 mL | Freq: Once | INTRAVENOUS | Status: AC
  Administered 2024-03-11: 1000 mL via INTRAVENOUS

## 2024-03-11 MED ORDER — PHENYLEPHRINE HCL-NACL 20-0.9 MG/250ML-% IV SOLN
INTRAVENOUS | Status: AC
  Filled 2024-03-11: qty 250

## 2024-03-11 MED ORDER — VISTASEAL 10 ML SINGLE DOSE KIT
PACK | CUTANEOUS | Status: DC | PRN
Start: 2024-03-11 — End: 2024-03-11
  Administered 2024-03-11: 10 mL via TOPICAL

## 2024-03-11 MED ORDER — FENTANYL CITRATE (PF) 100 MCG/2ML IJ SOLN
25.0000 ug | INTRAMUSCULAR | Status: DC | PRN
  Administered 2024-03-11 (×4): 25 ug via INTRAVENOUS

## 2024-03-11 MED ORDER — PHENYLEPHRINE HCL-NACL 20-0.9 MG/250ML-% IV SOLN
INTRAVENOUS | Status: AC
Start: 2024-03-11 — End: 2024-03-11
  Filled 2024-03-11: qty 250

## 2024-03-11 MED ORDER — ONDANSETRON HCL 4 MG/2ML IJ SOLN
4.0000 mg | Freq: Once | INTRAMUSCULAR | Status: DC | PRN
Start: 2024-03-11 — End: 2024-03-11

## 2024-03-11 MED ORDER — PHENYLEPHRINE 80 MCG/ML (10ML) SYRINGE FOR IV PUSH (FOR BLOOD PRESSURE SUPPORT)
PREFILLED_SYRINGE | INTRAVENOUS | Status: DC | PRN
  Administered 2024-03-11: 80 ug via INTRAVENOUS
  Administered 2024-03-11: 160 ug via INTRAVENOUS

## 2024-03-11 MED ORDER — DEXMEDETOMIDINE HCL IN NACL 200 MCG/50ML IV SOLN
INTRAVENOUS | Status: DC | PRN
Start: 2024-03-11 — End: 2024-03-11
  Administered 2024-03-11: 4 ug via INTRAVENOUS
  Administered 2024-03-11 (×2): 8 ug via INTRAVENOUS

## 2024-03-11 MED ORDER — BUPIVACAINE-EPINEPHRINE (PF) 0.25% -1:200000 IJ SOLN
INTRAMUSCULAR | Status: AC
  Filled 2024-03-11: qty 30

## 2024-03-11 MED ORDER — INDOCYANINE GREEN 25 MG IV SOLR
1.2500 mg | Freq: Once | INTRAVENOUS | Status: AC
  Administered 2024-03-11: 1.25 mg via INTRAVENOUS

## 2024-03-11 MED ORDER — LACTATED RINGERS IV SOLN: INTRAVENOUS | Status: DC | PRN

## 2024-03-11 MED ORDER — MIDAZOLAM HCL 2 MG/2ML IJ SOLN
INTRAMUSCULAR | Status: AC
  Filled 2024-03-11: qty 2

## 2024-03-11 MED ORDER — NOREPINEPHRINE 4 MG/250ML-% IV SOLN
INTRAVENOUS | Status: AC
Start: 2024-03-11 — End: 2024-03-11
  Filled 2024-03-11: qty 250

## 2024-03-11 MED ORDER — PROPOFOL 10 MG/ML IV BOLUS
INTRAVENOUS | Status: DC | PRN
  Administered 2024-03-11: 200 mg via INTRAVENOUS

## 2024-03-11 MED ORDER — VISTASEAL 10 ML SINGLE DOSE KIT
PACK | CUTANEOUS | Status: AC
  Filled 2024-03-11: qty 10

## 2024-03-11 MED ORDER — GLYCOPYRROLATE 0.2 MG/ML IJ SOLN
INTRAMUSCULAR | Status: DC | PRN
  Administered 2024-03-11: .2 mg via INTRAVENOUS

## 2024-03-11 MED ORDER — FENTANYL CITRATE (PF) 100 MCG/2ML IJ SOLN
INTRAMUSCULAR | Status: DC | PRN
  Administered 2024-03-11 (×2): 50 ug via INTRAVENOUS

## 2024-03-11 SURGICAL SUPPLY — 48 items
APPLICATOR VISTASEAL 35 (MISCELLANEOUS) IMPLANT
APPLICATOR VISTASEAL FLEXIBLE (MISCELLANEOUS) IMPLANT
BAG PRESSURE INF REUSE 1000 (BAG) IMPLANT
CANNULA REDUCER 12-8 DVNC XI (CANNULA) ×2 IMPLANT
CATH REDDICK CHOLANGI 4FR 50CM (CATHETERS) IMPLANT
CAUTERY HOOK MNPLR 1.6 DVNC XI (INSTRUMENTS) ×2 IMPLANT
CLIP LIGATING HEM O LOK PURPLE (MISCELLANEOUS) IMPLANT
CLIP LIGATING HEMO O LOK GREEN (MISCELLANEOUS) ×2 IMPLANT
DEFOGGER SCOPE WARM SEASHARP (MISCELLANEOUS) ×2 IMPLANT
DERMABOND ADVANCED .7 DNX12 (GAUZE/BANDAGES/DRESSINGS) ×2 IMPLANT
DRAIN CHANNEL JP 15F RND 3/16 (MISCELLANEOUS) IMPLANT
DRAPE ARM DVNC X/XI (DISPOSABLE) ×8 IMPLANT
DRAPE C-ARM XRAY 36X54 (DRAPES) IMPLANT
DRAPE COLUMN DVNC XI (DISPOSABLE) ×2 IMPLANT
ELECTRODE REM PT RTRN 9FT ADLT (ELECTROSURGICAL) ×2 IMPLANT
EVACUATOR SILICONE 100CC (DRAIN) IMPLANT
FORCEPS BPLR 8 MD DVNC XI (FORCEP) IMPLANT
FORCEPS BPLR FENES DVNC XI (FORCEP) ×2 IMPLANT
FORCEPS PROGRASP DVNC XI (FORCEP) ×2 IMPLANT
GLOVE BIO SURGEON STRL SZ 6.5 (GLOVE) ×4 IMPLANT
GLOVE BIOGEL PI IND STRL 6.5 (GLOVE) ×4 IMPLANT
GLOVE SURG SYN 6.5 PF PI (GLOVE) ×4 IMPLANT
GOWN STRL REUS W/ TWL LRG LVL3 (GOWN DISPOSABLE) ×8 IMPLANT
GRASPER SUT TROCAR 14GX15 (MISCELLANEOUS) ×2 IMPLANT
IRRIGATOR SUCT 8 DISP DVNC XI (IRRIGATION / IRRIGATOR) IMPLANT
IV CATH ANGIO 12GX3 LT BLUE (NEEDLE) IMPLANT
IV NS 1000ML BAXH (IV SOLUTION) IMPLANT
KIT PINK PAD W/HEAD ARM REST (MISCELLANEOUS) ×2 IMPLANT
LABEL OR SOLS (LABEL) ×2 IMPLANT
MANIFOLD NEPTUNE II (INSTRUMENTS) IMPLANT
NDL HYPO 22X1.5 SAFETY MO (MISCELLANEOUS) ×2 IMPLANT
NDL INSUFFLATION 14GA 120MM (NEEDLE) ×2 IMPLANT
NEEDLE HYPO 22X1.5 SAFETY MO (MISCELLANEOUS) ×2 IMPLANT
NEEDLE INSUFFLATION 14GA 120MM (NEEDLE) ×2 IMPLANT
NS IRRIG 500ML POUR BTL (IV SOLUTION) ×2 IMPLANT
OBTURATOR OPTICALSTD 8 DVNC (TROCAR) ×2 IMPLANT
PACK LAP CHOLECYSTECTOMY (MISCELLANEOUS) ×2 IMPLANT
SEAL UNIV 5-12 XI (MISCELLANEOUS) ×8 IMPLANT
SET TUBE SMOKE EVAC HIGH FLOW (TUBING) ×2 IMPLANT
SOLUTION ELECTROSURG ANTI STCK (MISCELLANEOUS) ×2 IMPLANT
SPIKE FLUID TRANSFER (MISCELLANEOUS) ×4 IMPLANT
SPONGE T-LAP 4X18 ~~LOC~~+RFID (SPONGE) IMPLANT
SUT VICRYL 0 UR6 27IN ABS (SUTURE) ×2 IMPLANT
SUTURE EHLN 3-0 FS-10 30 BLK (SUTURE) IMPLANT
SUTURE MNCRL 4-0 27XMF (SUTURE) ×2 IMPLANT
SYSTEM BAG RETRIEVAL 10MM (BASKET) ×2 IMPLANT
TRAP FLUID SMOKE EVACUATOR (MISCELLANEOUS) IMPLANT
WATER STERILE IRR 500ML POUR (IV SOLUTION) ×2 IMPLANT

## 2024-03-11 NOTE — Plan of Care (Signed)

## 2024-03-11 NOTE — Discharge Instructions (Signed)

## 2024-03-11 NOTE — Hospital Course (Addendum)
 73yo with h/o HTN, HLD, ETOH use d/o, MGUS, and class 1 obesity who presented on  7/21 with abdominal pain and fever to 103.  CT with mild GB distention, ?acute cholecystitis.  RUQ US  with cholelithiasis and GB sludge without acute cholecystitis.  Surgery consulted.  He was started on Zosyn .  Lap chole on 7/22 with purulent cholecystitis, drain placed in RUQ.

## 2024-03-11 NOTE — Anesthesia Procedure Notes (Signed)
 Procedure Name: Intubation Date/Time: 03/11/2024 12:19 PM  Performed by: Ledora Duncan, CRNAPre-anesthesia Checklist: Patient identified, Emergency Drugs available, Suction available and Patient being monitored Patient Re-evaluated:Patient Re-evaluated prior to induction Oxygen Delivery Method: Circle system utilized Preoxygenation: Pre-oxygenation with 100% oxygen Induction Type: IV induction Ventilation: Mask ventilation without difficulty Laryngoscope Size: McGrath and 4 Grade View: Grade I Tube type: Oral Tube size: 7.0 mm Number of attempts: 1 Airway Equipment and Method: Stylet and Video-laryngoscopy Placement Confirmation: ETT inserted through vocal cords under direct vision, positive ETCO2 and breath sounds checked- equal and bilateral Secured at: 22 cm Tube secured with: Tape Dental Injury: Teeth and Oropharynx as per pre-operative assessment

## 2024-03-11 NOTE — Anesthesia Preprocedure Evaluation (Signed)
 Anesthesia Evaluation  Patient identified by MRN, date of birth, ID band Patient awake    Reviewed: Allergy & Precautions, NPO status , Patient's Chart, lab work & pertinent test results  Airway Mallampati: II  TM Distance: >3 FB Neck ROM: full    Dental  (+) Teeth Intact, Caps, Dental Advisory Given   Pulmonary neg pulmonary ROS, COPD   Pulmonary exam normal breath sounds clear to auscultation       Cardiovascular Exercise Tolerance: Good hypertension, Pt. on medications negative cardio ROS Normal cardiovascular exam Rhythm:Regular Rate:Normal     Neuro/Psych negative neurological ROS  negative psych ROS   GI/Hepatic negative GI ROS, Neg liver ROS,GERD  Medicated,,  Endo/Other  negative endocrine ROS  Class 3 obesity  Renal/GU   negative genitourinary   Musculoskeletal negative musculoskeletal ROS (+)    Abdominal  (+) + obese  Peds negative pediatric ROS (+)  Hematology negative hematology ROS (+)   Anesthesia Other Findings Past Medical History: No date: Dental bridge present     Comment:  upper No date: GERD (gastroesophageal reflux disease) No date: Gout No date: Hypercholesteremia No date: Hypertension No date: Motion sickness     Comment:  swings  Past Surgical History: 09/28/2016: COLONOSCOPY WITH ESOPHAGOGASTRODUODENOSCOPY (EGD) 08/29/2019: KNEE ARTHROSCOPY WITH MEDIAL MENISECTOMY; Left     Comment:  Procedure: LEFT KNEE ARTHROSCOPIC PARTIAL MEDIAL               MENISCECTOMY;  Surgeon: Tobie Priest, MD;  Location:               [facility];  Service: Orthopedics;  Laterality:               Left;  BMI    Body Mass Index: 31.48 kg/m      Reproductive/Obstetrics negative OB ROS                              Anesthesia Physical Anesthesia Plan  ASA: 3  Anesthesia Plan: General   Post-op Pain Management:    Induction: Inhalational  PONV Risk Score and  Plan: Ondansetron , Dexamethasone , Midazolam  and Treatment may vary due to age or medical condition  Airway Management Planned: Natural Airway and Nasal Cannula  Additional Equipment:   Intra-op Plan:   Post-operative Plan: Extubation in OR  Informed Consent: I have reviewed the patients History and Physical, chart, labs and discussed the procedure including the risks, benefits and alternatives for the proposed anesthesia with the patient or authorized representative who has indicated his/her understanding and acceptance.     Dental Advisory Given  Plan Discussed with: CRNA  Anesthesia Plan Comments:         Anesthesia Quick Evaluation

## 2024-03-11 NOTE — Transfer of Care (Signed)
 Immediate Anesthesia Transfer of Care Note  Patient: Eric Henderson  Procedure(s) Performed: CHOLECYSTECTOMY, ROBOT-ASSISTED, LAPAROSCOPIC INDOCYANINE GREEN  FLUORESCENCE IMAGING (ICG)  Patient Location: PACU  Anesthesia Type:General  Level of Consciousness: drowsy and patient cooperative  Airway & Oxygen Therapy: Patient Spontanous Breathing and Patient connected to face mask oxygen  Post-op Assessment: Report given to RN and Post -op Vital signs reviewed and stable  Post vital signs: Reviewed and stable  Last Vitals:  Vitals Value Taken Time  BP 117/73 03/11/24 14:16  Temp    Pulse 81 03/11/24 14:19  Resp 30 03/11/24 14:19  SpO2 100 % 03/11/24 14:19  Vitals shown include unfiled device data.  Last Pain:  Vitals:   03/11/24 1108  TempSrc: Temporal  PainSc: 0-No pain         Complications: No notable events documented.

## 2024-03-11 NOTE — Plan of Care (Signed)
  Problem: Education: Goal: Knowledge of General Education information will improve Description: Including pain rating scale, medication(s)/side effects and non-pharmacologic comfort measures Outcome: Progressing   Problem: Health Behavior/Discharge Planning: Goal: Ability to manage health-related needs will improve Outcome: Progressing   Problem: Clinical Measurements: Goal: Ability to maintain clinical measurements within normal limits will improve Outcome: Progressing Goal: Will remain free from infection Outcome: Progressing Goal: Diagnostic test results will improve Outcome: Progressing Goal: Respiratory complications will improve Outcome: Progressing Goal: Cardiovascular complication will be avoided Outcome: Progressing   Problem: Nutrition: Goal: Adequate nutrition will be maintained Outcome: Progressing   Problem: Activity: Goal: Risk for activity intolerance will decrease Outcome: Progressing   Problem: Elimination: Goal: Will not experience complications related to bowel motility Outcome: Progressing Goal: Will not experience complications related to urinary retention Outcome: Progressing   Problem: Pain Managment: Goal: General experience of comfort will improve and/or be controlled Outcome: Progressing   Problem: Safety: Goal: Ability to remain free from injury will improve Outcome: Progressing

## 2024-03-11 NOTE — Progress Notes (Signed)
 Progress Note   Patient: Eric Henderson FMW:[id number] DOB: [birth year] DOA: 03/10/2024     1 DOS: the patient was seen and examined on 03/11/2024   Brief hospital course: 73yo with h/o HTN, HLD, ETOH use d/o, MGUS, and class 1 obesity who presented on  7/21 with abdominal pain and fever to 103.  CT with mild GB distention, ?acute cholecystitis.  RUQ US  with cholelithiasis and GB sludge without acute cholecystitis.  Surgery consulted.  He was started on Zosyn .  Lap chole on 7/22 with purulent cholecystitis, drain placed in RUQ.  Assessment and Plan:  Acute cholecystitis Clinical presentation c/w cholecystitis and CT findings concur Surgery consulted Underwent lap chole with drain placement on 7/22  Admitted to MedSurg bed as inpatient IV Zosyn  Follow-up blood cultures Pain control: As needed morphine , oxycodone , Tylenol  As needed Zofran    Hypertension Continue Toprol  XL Hold lisinopril due to AKI on presentation IV hydralazine  as needed   Hypercholesteremia Switch lovastatin to pravastatin  in hospital   AKI (acute kidney injury) Likely due to dehydration continuation of lisinopril Resolved today Hold lisinopril, resume when needed   Alcohol abuse Currently no signs of withdrawal yet Given 25 mg of LIbrium  CIWA protocol Needs counseling about importance of cutting down alcohol drinking   Class 1 obesity Body mass index is 31.48 kg/m.SABRA  Weight loss should be encouraged Outpatient PCP/bariatric medicine f/u encouraged Significantly low or high BMI is associated with higher medical risk including morbidity and mortality     Consultants: Surgery  Procedures: Lap chole 7/22  Antibiotics: Zosyn  7/21-  30 Day Unplanned Readmission Risk Score    Flowsheet Row ED to Hosp-Admission (Current) from 03/10/2024 in [facility]  30 Day Unplanned Readmission Risk Score (%) 6.63 Filed at 03/11/2024 0400    This score is the patient's risk  of an unplanned readmission within 30 days of being discharged (0 -100%). The score is based on dignosis, age, lab data, medications, orders, and past utilization.   Low:  0-14.9   Medium: 15-21.9   High: 22-29.9   Extreme: 30 and above           Subjective: Seen post-operatively, feels bloated.   Objective: Vitals:   03/11/24 1430 03/11/24 1500  BP: 124/80 117/88  Pulse: 78 71  Resp: (!) 22 12  Temp:  97.8 F (36.6 C)  SpO2: 99% 99%    Intake/Output Summary (Last 24 hours) at 03/11/2024 1539 Last data filed at 03/11/2024 1501 Gross per 24 hour  Intake 1942.03 ml  Output 10 ml  Net 1932.03 ml   Filed Weights   03/10/24 1543 03/11/24 0438  Weight: 91.6 kg 93.9 kg    Exam:  General:  Appears calm and comfortable and is in NAD Eyes:   normal lids, iris ENT:  grossly normal hearing, lips & tongue, mmm Cardiovascular:  RRR. No LE edema.  Respiratory:   CTA bilaterally with no wheezes/rales/rhonchi.  Normal respiratory effort. Abdomen:  soft, mildly distended, appropriately TTP, drain in place with small amount of serous drainage Skin:  no rash or induration seen on limited exam Musculoskeletal:  grossly normal tone BUE/BLE, good ROM, no bony abnormality Psychiatric:  grossly normal mood and affect, speech fluent and appropriate, AOx3 Neurologic:  CN 2-12 grossly intact, moves all extremities in coordinated fashion  Data Reviewed: I have reviewed the patient's lab results since admission.  Pertinent labs for today include:   Na++ 134, improved Glucose 112 Albumin  2.9 WBC 12.9, down from  18.4 Hgb 11.2 Lactate 1.1, 1.0 Blood cultures pending    Family Communication: Wife was present throughout evaluation  Disposition: Status is: Inpatient Remains inpatient appropriate because: post-operative management     Time spent: 50 minutes  Unresulted Labs (From admission, onward)     Start     Ordered   03/12/24 0500  CBC with Differential/Platelet  Tomorrow  morning,   R        03/11/24 1539   03/12/24 0500  Basic metabolic panel with GFR  Tomorrow morning,   R        03/11/24 1539   03/10/24 2312  Culture, blood (Routine X 2) w Reflex to ID Panel  BLOOD CULTURE X 2,   TIMED      03/10/24 2311             Author: Delon Herald, MD 03/11/2024 3:39 PM  For on call review [email or web address].

## 2024-03-11 NOTE — Consult Note (Signed)
 [facility]-General Surgery  SURGICAL CONSULTATION NOTE    HISTORY OF PRESENT ILLNESS (HPI):  73 y.o. male presented to [facility] ED last night for evaluation of right upper quadrant pain. Patient reports having this pain for about 4-5 days which has been constant, radiating to left upper quadrant. He thought he was constipated and took magnesium citrate. He had a bowel movement but no improvement of symptoms. No known aggravating or alleviating factors. No history of prior abdominal surgeries.   In the ED, patient was hypertensive with a BP of 138/104, tachycardic with HR of 107, afebrile, and RR 20. Labs indicated leukocytosis with WBC of 18.4 and normal Hbg of 13.0. Normal LFTs and alka phos levels. Total bilirubin of 1.4. Elevated creatinine of 1.42. Normal lipase 24. Ultrasound of abdomen showed cholelithiasis and sludge with no evidence of acute cholecystitis. CBD measuring 3.6 mm.  CT of abdomen and pelvis showed slight distension of gallbladder with mild pericholecystitis stranding. Recommended HIDA scan.   Surgery is consulted by Dr. Nicholaus in this context for evaluation and management cholelithiasis.   PAST MEDICAL HISTORY (PMH):  Past Medical History:  Diagnosis Date   Dental bridge present    upper   GERD (gastroesophageal reflux disease)    Gout    Hypercholesteremia    Hypertension    Motion sickness    swings     PAST SURGICAL HISTORY (PSH):  Past Surgical History:  Procedure Laterality Date   COLONOSCOPY WITH ESOPHAGOGASTRODUODENOSCOPY (EGD)  09/28/2016   KNEE ARTHROSCOPY WITH MEDIAL MENISECTOMY Left 08/29/2019   Procedure: LEFT KNEE ARTHROSCOPIC PARTIAL MEDIAL MENISCECTOMY;  Surgeon: Tobie Priest, MD;  Location: [facility];  Service: Orthopedics;  Laterality: Left;     MEDICATIONS:  Prior to Admission medications   Medication Sig Start Date End Date Taking? Authorizing Provider  azelastine (ASTELIN) 0.1 % nasal spray Place 1 spray into the nose 2 (two) times  daily as needed for allergies. 02/21/24 03/22/24 Yes [provider]  Cholecalciferol 125 MCG (5000 UT) capsule Take 5,000 Units by mouth daily.   Yes [provider]  colchicine 0.6 MG tablet Take 0.6-1.2 mg by mouth as needed. Take 2 tablets (1.2mg ) by mouth at first sign of gout flare followed by 1 tablet (0.6mg ) after 1 hour. (Max 1.8mg  within 1 hour) 02/07/23  Yes [provider]  fluticasone (FLONASE) 50 MCG/ACT nasal spray Place 1 spray into the nose 2 (two) times daily as needed for rhinitis. 10/01/23 09/30/24 Yes [provider]  ipratropium (ATROVENT) 0.06 % nasal spray Place 2 sprays into the nose 3 (three) times daily as needed for rhinitis. 10/01/23 09/30/24 Yes [provider]  lisinopril (ZESTRIL) 20 MG tablet Take 20 mg by mouth daily. FOR BLOOD PRESSURE 10/08/23  Yes [provider]  lovastatin (MEVACOR) 40 MG tablet Take 40 mg by mouth 2 (two) times daily. FOR CHOLESTEROL. Needs to schedule follow-up visit with PCP. 11/23/23  Yes [provider]  meloxicam (MOBIC) 7.5 MG tablet Take 7.5 mg by mouth daily. 12/13/23  Yes [provider]  metoprolol  succinate (TOPROL -XL) 25 MG 24 hr tablet Take 25 mg by mouth daily.  04/05/18  Yes [provider]  Omega-3 Fatty Acids (FISH OIL) 1000 MG CAPS Take 1 capsule by mouth daily.    Yes [provider]  omeprazole (PRILOSEC) 40 MG capsule TAKE 1 CAPSULE (40 MG TOTAL) BY MOUTH ONCE DAILY PATIENT NEEDS AN APPOINTMENT FOR FURTHER REFILLS. 03/11/18  Yes [provider]  vitamin B-12 (  CYANOCOBALAMIN) 1000 MCG tablet Take 1,000 mcg by mouth daily.    Yes [provider]     ALLERGIES:  No Known Allergies   SOCIAL HISTORY:  Social History   Socioeconomic History   Marital status: Single    Spouse name: Not on file   Number of children: Not on file   Years of education: Not on file   Highest education level: Not on file  Occupational History   Not  on file  Tobacco Use   Smoking status: Never   Smokeless tobacco: Never  Vaping Use   Vaping status: Never Used  Substance and Sexual Activity   Alcohol use: Yes    Alcohol/week: 70.0 standard drinks of alcohol    Types: 35 Cans of beer, 35 Shots of liquor per week    Comment: 10 drinks daily/ beer & vodka   Drug use: Never   Sexual activity: Not on file  Other Topics Concern   Not on file  Social History Narrative   Not on file   Social Drivers of Health   Financial Resource Strain: Low Risk  (07/05/2017)   Received from [facility]   Overall Financial Resource Strain (CARDIA)    Difficulty of Paying Living Expenses: Not hard at all  Food Insecurity: No Food Insecurity (03/11/2024)   Hunger Vital Sign    Worried About Running Out of Food in the Last Year: Never true    Ran Out of Food in the Last Year: Never true  Transportation Needs: No Transportation Needs (03/11/2024)   PRAPARE - Administrator, Civil Service (Medical): No    Lack of Transportation (Non-Medical): No  Physical Activity: Sufficiently Active (07/05/2017)   Received from [facility]   Exercise Vital Sign    Days of Exercise per Week: 7 days    Minutes of Exercise per Session: 30 min  Stress: No Stress Concern Present (07/05/2017)   Received from [facility] - Occupational Stress Questionnaire    Feeling of Stress : Not at all  Social Connections: Socially Integrated (03/11/2024)   Social Connection and Isolation Panel    Frequency of Communication with Friends and Family: More than three times a week    Frequency of Social Gatherings with Friends and Family: Three times a week    Attends Religious Services: More than 4 times per year    Active Member of Clubs or Organizations: Yes    Attends Banker Meetings: More than 4 times per year    Marital Status: Married  Catering manager  Violence: Not At Risk (03/11/2024)   Humiliation, Afraid, Rape, and Kick questionnaire    Fear of Current or Ex-Partner: No    Emotionally Abused: No    Physically Abused: No    Sexually Abused: No     FAMILY HISTORY:  History reviewed. No pertinent family history.    REVIEW OF SYSTEMS:  Review of Systems  Constitutional:  Negative for chills and fever.  Respiratory:  Negative for shortness of breath and wheezing.   Cardiovascular:  Negative for chest pain and palpitations.  Gastrointestinal:  Positive for abdominal pain. Negative for nausea and vomiting.    VITAL SIGNS:  Temp:  [97.8 F (36.6 C)-99.3 F (37.4 C)] 99.3 F (37.4 C) (07/22 0722) Pulse Rate:  [64-107] 82 (07/22 0722) Resp:  [16-20] 17 (07/22 0722) BP: (99-138)/(59-104) 100/59 (07/22 0722) SpO2:  [95 %-100 %]  95 % (07/22 0722) Weight:  [91.6 kg-93.9 kg] 93.9 kg (07/22 0438)     Height: 5' 8 (172.7 cm) Weight: 93.9 kg BMI (Calculated): 31.48   INTAKE/OUTPUT:  07/21 0701 - 07/22 0700 In: 1317 [I.V.:317; IV Piggyback:1000] Out: -   PHYSICAL EXAM:  Physical Exam Constitutional:      Appearance: He is well-developed.  HENT:     Head: Normocephalic and atraumatic.  Cardiovascular:     Rate and Rhythm: Normal rate and regular rhythm.  Pulmonary:     Effort: Pulmonary effort is normal.     Breath sounds: Normal breath sounds.  Abdominal:     General: Abdomen is flat.     Palpations: Abdomen is soft.     Tenderness: There is abdominal tenderness in the right upper quadrant. There is no guarding or rebound.  Neurological:     Mental Status: He is alert.      Labs:     Latest Ref Rng & Units 03/11/2024    6:24 AM 03/10/2024    3:56 PM 09/22/2020    9:37 AM  CBC  WBC 4.0 - 10.5 K/uL 12.9  18.4  5.1   Hemoglobin 13.0 - 17.0 g/dL 88.7  86.9  87.1   Hematocrit 39.0 - 52.0 % 33.3  37.9  38.7   Platelets 150 - 400 K/uL 219  265  202       Latest Ref Rng & Units 03/11/2024    6:24 AM 03/10/2024    3:56 PM  09/22/2020    9:37 AM  CMP  Glucose 70 - 99 mg/dL 887  851  892   BUN 8 - 23 mg/dL 15  16  13    Creatinine 0.61 - 1.24 mg/dL 8.88  8.57  8.95   Sodium 135 - 145 mmol/L 134  132  138   Potassium 3.5 - 5.1 mmol/L 4.0  3.8  4.1   Chloride 98 - 111 mmol/L 103  99  102   CO2 22 - 32 mmol/L 23  23  21    Calcium  8.9 - 10.3 mg/dL 8.1  8.9  8.6   Total Protein 6.5 - 8.1 g/dL 6.1  7.2  7.1   Total Bilirubin 0.0 - 1.2 mg/dL 1.3  1.4  1.1   Alkaline Phos 38 - 126 U/L 52  58  58   AST 15 - 41 U/L 14  19  33   ALT 0 - 44 U/L 11  11  37     Imaging studies:   CLINICAL DATA:  Elevated bilirubin.   EXAM: 03/10/24 ULTRASOUND ABDOMEN LIMITED RIGHT UPPER QUADRANT   COMPARISON:  None Available.   FINDINGS: Gallbladder:   Gallstones and echogenic sludge are seen within the gallbladder lumen. The largest gallstone measures 5.7 mm. There is no evidence of gallbladder wall thickening (2.3 mm) no sonographic Murphy sign noted by sonographer.   Common bile duct:   Diameter: 3.6 mm   Liver:   The left lobe of the liver is limited in evaluation secondary to overlying bowel gas. No focal lesion identified. Within normal limits in parenchymal echogenicity. Portal vein is patent on color Doppler imaging with normal direction of blood flow towards the liver.   Other: The study is technically limited secondary to overlying bowel gas, as per the ultrasound technologist.   IMPRESSION: 1. Cholelithiasis and gallbladder sludge without evidence of acute cholecystitis.     Electronically Signed   By: Suzen Dials M.D.   On: 03/10/2024 22:08  CLINICAL DATA:  Worsening upper quadrant pain fever   EXAM: 03/10/24 CT ABDOMEN AND PELVIS WITH CONTRAST   TECHNIQUE: Multidetector CT imaging of the abdomen and pelvis was performed using the standard protocol following bolus administration of intravenous contrast.   RADIATION DOSE REDUCTION: This exam was performed according to  the departmental dose-optimization program which includes automated exposure control, adjustment of the mA and/or kV according to patient size and/or use of iterative reconstruction technique.   CONTRAST:  OMNIPAQUE  IOHEXOL  300 MG/ML  SOLN   COMPARISON:  Ultrasound 03/10/2024   FINDINGS: Lower chest: Lung bases demonstrate no acute airspace disease. Mitral calcification.   Hepatobiliary: No focal hepatic abnormality or biliary dilatation. Slightly distended gallbladder. No calcified stone. Mild pericholecystic stranding   Pancreas: Unremarkable. No pancreatic ductal dilatation or surrounding inflammatory changes.   Spleen: Normal in size without focal abnormality.   Adrenals/Urinary Tract: Adrenal glands are normal. Kidneys show no hydronephrosis. Small left-sided kidney stones measuring up to 5 mm. The bladder is unremarkable   Stomach/Bowel: Stomach is within normal limits. Appendix appears normal. No evidence of bowel wall thickening, distention, or inflammatory changes. Diverticular disease of the left colon.   Vascular/Lymphatic: Aortic atherosclerosis. No enlarged abdominal or pelvic lymph nodes.   Reproductive: Negative prostate   Other: Negative for pelvic effusion or free air   Musculoskeletal: No acute or suspicious osseous abnormality   IMPRESSION: 1. Slightly distended gallbladder with mild pericholecystic stranding. No calcified stone is seen. CT appearance is suspicious for acute cholecystitis but this was not confirmed on preceding ultrasound, if further imaging is required, correlation with nuclear medicine hepatobiliary imaging could be obtained. 2. Left kidney stones. Diverticular disease of the left colon without acute inflammatory process. 3. Aortic atherosclerosis.   Aortic Atherosclerosis (ICD10-I70.0).     Electronically Signed   By: Luke Bun M.D.   On: 03/10/2024 22:18  Assessment/Plan: 73 y.o. male with cholelithiasis,  complicated by pertinent comorbidities including hypertension, hypercholesteremia, obesity, GERD, and alcohol abuse.   - Stable vital signs    - Leukocytosis improving 18.4 >> 12.9   - HIDA scan was recommended to confirm acute cholecystitis since US  and CT were inconclusive. Clinical picture of sudden onset of RUQ pain with leukocytosis and sludge/gallstones shown on imaging corresponds to acute cholecystitis. No further imaging is necessary at this time. Discussed the recommendation of robotic-assisted laparoscopic cholecystectomy to prevent reoccurrence or future complications. Patient agrees with plan and will like to proceed with surgery.   - Continue NPO and IV fluids   - Continue IV Zosyn    - Continue pain management and DVT prophylaxis   Thank you for the opportunity to participate in this patient's care.   -- Gilmer Cea PA-C

## 2024-03-11 NOTE — ED Notes (Signed)
Niu, MD at bedside. °

## 2024-03-11 NOTE — Care Management Important Message (Signed)
 Important Message  Patient Details  Name: Eric Henderson MRN: [id number] Date of Birth: [birth year]   Important Message Given:  Yes - Medicare IM     Rojelio SHAUNNA Rattler 03/11/2024, 1:22 PM

## 2024-03-11 NOTE — TOC CM/SW Note (Addendum)
 Transition of Care [facility]) - Inpatient Brief Assessment   Patient Details  Name: Eric Henderson MRN: [id number] Date of Birth: 16-Jul-1951  Transition of Care [facility]) CM/SW Contact:    Asberry CHRISTELLA Jaksch, RN Phone Number: 03/11/2024, 9:27 AM   Clinical Narrative:  Transition of Care [facility]) Screening Note   Patient Details  Name: Eric Henderson Date of Birth: [birth year]   Transition of Care [facility]) CM/SW Contact:    Asberry CHRISTELLA Jaksch, RN Phone Number: 03/11/2024, 9:27 AM    Transition of Care Department [facility]) has reviewed patient and no TOC needs have been identified at this time.  If new patient transition needs arise, please place a TOC consult.   TOC consult for substance abuse noted. Substance abuse resources were added to the AVS.   Transition of Care Asessment:

## 2024-03-11 NOTE — Op Note (Signed)
 Preoperative diagnosis: Acute cholecystitis  Postoperative diagnosis: Acute cholecystitis  Procedure: Robotic Assisted Laparoscopic Cholecystectomy.   Anesthesia: GETA   Surgeon: Dr. Cesar Coe  Wound Classification: Clean Contaminated  Indications: Patient is a 73 y.o. male developed right upper quadrant pain, leukocytosis and on workup was found to have cholelithiasis with a normal common duct and cholecystitis. Robotic Assisted Laparoscopic cholecystectomy was elected.  Findings: Purulent cholecystitis                 Description of procedure: The patient was placed on the operating table in the supine position. General anesthesia was induced. A time-out was completed verifying correct patient, procedure, site, positioning, and implant(s) and/or special equipment prior to beginning this procedure. An orogastric tube was placed. The abdomen was prepped and draped in the usual sterile fashion.  An incision was made in a natural skin line below the umbilicus.  The fascia was elevated and the Veress needle inserted. Proper position was confirmed by aspiration and saline meniscus test.  The abdomen was insufflated with carbon dioxide to a pressure of 15 mmHg. The patient tolerated insufflation well. A 8-mm trocar was then inserted in optiview fashion.  The laparoscope was inserted and the abdomen inspected. No injuries from initial trocar placement were noted. Additional trocars were then inserted in the following locations: an 8-mm trocar in the left lateral abdomen, and another two 8-mm trocars to the right side of the abdomen 5 cm appart. The umbilical trocar was changed to a 12 mm trocar all under direct visualization. The abdomen was inspected and no abnormalities were found. The table was placed in the reverse Trendelenburg position with the right side up. The robotic arms were docked and target anatomy identified. Instrument inserted under direct visualization.  Filmy  adhesions between the gallbladder and omentum, duodenum and transverse colon were lysed with electrocautery.  The gallbladder was found tense and needed to be drained.  The dome of the gallbladder was opened and the gallbladder content was aspirated.  Purulent bile was found and aspirated.  The dome of the gallbladder was grasped with a prograsp and retracted over the dome of the liver. The infundibulum was also grasped with an atraumatic grasper and retracted toward the right lower quadrant.  Even with this maneuver I was unable to identify the triangle of Calot and not able to dissect the critical view of safety.  I decided to proceed with opening the gallbladder at the body.  Abdomen amount of purulent bile was aspirated.  Small stones were suctioned.  At the abdominal down dissection of the gallbladder.  Looking from the inside I was able to identify the cystic duct.  I was able to dissect carefully the cystic duct and put 2 clips in the cystic duct.  Hemostasis was checked and the gallbladder and contained stones were removed using an endoscopic retrieval bag. The gallbladder was passed off the table as a specimen. The gallbladder fossa was copiously irrigated with saline and hemostasis was obtained. There was no evidence of bleeding from the gallbladder fossa or cystic artery or leakage of the bile from the cystic duct stump.  Vistaseal  was irrigated in the gallbladder bed.  A 15 French drain was left in place in the right upper quadrant.  Secondary trocars were removed under direct vision. No bleeding was noted. The robotic arms were undoked. The scope was withdrawn and the umbilical trocar removed. The abdomen was allowed to collapse. The fascia of the 12mm trocar sites was closed with  figure-of-eight 0 vicryl sutures. The skin was closed with subcuticular sutures of 4-0 monocryl and topical skin adhesive. The orogastric tube was removed.  The patient tolerated the procedure well and was taken to the  postanesthesia care unit in stable condition.   Specimen: Gallbladder  Complications: None  EBL: 50 mL

## 2024-03-12 ENCOUNTER — Encounter: Payer: Self-pay | Admitting: General Surgery

## 2024-03-12 DIAGNOSIS — R1011 Right upper quadrant pain: Secondary | ICD-10-CM | POA: Diagnosis not present

## 2024-03-12 LAB — CBC WITH DIFFERENTIAL/PLATELET
Abs Immature Granulocytes: 0.07 K/uL (ref 0.00–0.07)
Basophils Absolute: 0 K/uL (ref 0.0–0.1)
Basophils Relative: 0 %
Eosinophils Absolute: 0 K/uL (ref 0.0–0.5)
Eosinophils Relative: 0 %
HCT: 35.1 % — ABNORMAL LOW (ref 39.0–52.0)
Hemoglobin: 11.7 g/dL — ABNORMAL LOW (ref 13.0–17.0)
Immature Granulocytes: 1 %
Lymphocytes Relative: 7 %
Lymphs Abs: 0.8 K/uL (ref 0.7–4.0)
MCH: 32.6 pg (ref 26.0–34.0)
MCHC: 33.3 g/dL (ref 30.0–36.0)
MCV: 97.8 fL (ref 80.0–100.0)
Monocytes Absolute: 0.7 K/uL (ref 0.1–1.0)
Monocytes Relative: 6 %
Neutro Abs: 10 K/uL — ABNORMAL HIGH (ref 1.7–7.7)
Neutrophils Relative %: 86 %
Platelets: 260 K/uL (ref 150–400)
RBC: 3.59 MIL/uL — ABNORMAL LOW (ref 4.22–5.81)
RDW: 14.7 % (ref 11.5–15.5)
WBC: 11.6 K/uL — ABNORMAL HIGH (ref 4.0–10.5)
nRBC: 0 % (ref 0.0–0.2)

## 2024-03-12 LAB — BASIC METABOLIC PANEL WITH GFR
Anion gap: 11 (ref 5–15)
BUN: 14 mg/dL (ref 8–23)
CO2: 23 mmol/L (ref 22–32)
Calcium: 8.5 mg/dL — ABNORMAL LOW (ref 8.9–10.3)
Chloride: 105 mmol/L (ref 98–111)
Creatinine, Ser: 1.06 mg/dL (ref 0.61–1.24)
GFR, Estimated: >60 mL/min (ref >60)
Glucose, Bld: 135 mg/dL — ABNORMAL HIGH (ref 70–99)
Potassium: 5 mmol/L (ref 3.5–5.1)
Sodium: 139 mmol/L (ref 135–145)

## 2024-03-12 LAB — GLUCOSE, CAPILLARY: Glucose-Capillary: 108 mg/dL — ABNORMAL HIGH (ref 70–99)

## 2024-03-12 NOTE — Plan of Care (Signed)

## 2024-03-12 NOTE — Progress Notes (Signed)
 [facility] General Surgery  SURGICAL PROGRESS NOTE  Hospital Day(s): 2.   Post op day(s): 1 Day Post-Op.   Interval History:  Patient seen and examined. No acute events or new complaints overnight.  Patient appears comfortable this morning. States he has been having abdominal discomfort. Did not have anything to eat last night. 20 cc output recorded from JP drain yesterday.    Vital signs in last 24 hours: [min-max] current  Temp:  [97.8 F (36.6 C)-98.6 F (37 C)] 98.6 F (37 C) (07/23 0753) Pulse Rate:  [69-82] 69 (07/23 0753) Resp:  [12-22] 20 (07/23 0753) BP: (110-133)/(73-88) 116/84 (07/23 0753) SpO2:  [91 %-100 %] 96 % (07/23 0753)     Height: 5' 8 (172.7 cm) Weight: 93.9 kg BMI (Calculated): 31.48   Intake/Output last 2 shifts:  07/22 0701 - 07/23 0700 In: 625 [P.O.:225; I.V.:300; IV Piggyback:100] Out: 320 [Urine:300; Drains:20]   Physical Exam:  Constitutional: alert, cooperative and no distress  Respiratory: breathing non-labored at rest  Cardiovascular: regular rate and sinus rhythm  Gastrointestinal: soft, mildly tender, and non-distended, abdominal incisions are clean and dry. Mild serosanguinous output noted from JP drain   Labs:     Latest Ref Rng & Units 03/12/2024    2:22 AM 03/11/2024    6:24 AM 03/10/2024    3:56 PM  CBC  WBC 4.0 - 10.5 K/uL 11.6  12.9  18.4   Hemoglobin 13.0 - 17.0 g/dL 88.2  88.7  86.9   Hematocrit 39.0 - 52.0 % 35.1  33.3  37.9   Platelets 150 - 400 K/uL 260  219  265       Latest Ref Rng & Units 03/12/2024    2:22 AM 03/11/2024    6:24 AM 03/10/2024    3:56 PM  CMP  Glucose 70 - 99 mg/dL 864  887  851   BUN 8 - 23 mg/dL 14  15  16    Creatinine 0.61 - 1.24 mg/dL 8.93  8.88  8.57   Sodium 135 - 145 mmol/L 139  134  132   Potassium 3.5 - 5.1 mmol/L 5.0  4.0  3.8   Chloride 98 - 111 mmol/L 105  103  99   CO2 22 - 32 mmol/L 23  23  23    Calcium  8.9 - 10.3 mg/dL 8.5  8.1  8.9   Total Protein 6.5 - 8.1 g/dL  6.1  7.2    Total Bilirubin 0.0 - 1.2 mg/dL  1.3  1.4   Alkaline Phos 38 - 126 U/L  52  58   AST 15 - 41 U/L  14  19   ALT 0 - 44 U/L  11  11     Imaging studies: No new pertinent imaging studies   Assessment/Plan:  73 y.o. male with acute cholecystitis 1 Day Post-Op s/p robotic-assisted laparoscopic cholecystectomy, complicated by pertinent comorbidities including hypertension, hypercholesteremia, obesity, GERD, and alcohol abuse. .   - Stable vital signs    - Leukocytosis improving 12.9 >> 11.6   - Continue to monitor JP output. If output continues to be minimal and serosanguinous, may remove before discharge   - Discussed possible discharge tomorrow if labs are stable, and patient is tolerating soft diet as well as ambulating with no issues  - Continue IV antibiotic therapy   - Continue pain management and DVT prophylaxis    -- Eric Matthis Barrientos PA-C

## 2024-03-12 NOTE — Progress Notes (Signed)
 PROGRESS NOTE    Eric Henderson  FMW:[id number] DOB: 1951-08-04 DOA: 03/10/2024 PCP: [name], NP    Brief Narrative:   [id number] with h/o HTN, HLD, ETOH use d/o, MGUS, and class 1 obesity who presented on  7/21 with abdominal pain and fever to 103.  CT with mild GB distention, ?acute cholecystitis.  RUQ US  with cholelithiasis and GB sludge without acute cholecystitis.  Surgery consulted.  He was started on Zosyn .  Lap chole on 7/22 with purulent cholecystitis, drain placed in RUQ.    Assessment & Plan:   Principal Problem:   Right upper quadrant abdominal pain Active Problems:   Hypertension   Hypercholesteremia   AKI (acute kidney injury) (HCC)   Alcohol abuse   Obesity (BMI 30-39.9)  Acute cholecystitis Clinical presentation c/w cholecystitis and CT findings concur Surgery consulted Underwent lap chole with drain placement on 7/22  Plan: Continue IV antibiotics for now.  Continue drain.  Ambulate patient.  Possible discharge 7/24.   Hypertension Continue Toprol -XL.  Hold lisinopril for now   Hypercholesteremia Statin   AKI (acute kidney injury) Resolved   Alcohol abuse Protocol.  No indication of acute withdrawals   Class 1 obesity Complicates overall care and prognosis   DVT prophylaxis: Lovenox Code Status: Full Family Communication: Spouse at bedside 7/23 Disposition Plan: Status is: Inpatient Remains inpatient appropriate because: Acute cholecystitis.  Drain placement.  Anticipate discharge 7/24.   Level of care: Med-Surg  Consultants:  Surgery  Procedures:  Laparoscopic cholecystectomy  Antimicrobials: Zosyn    Subjective: Seen and examined.  Sitting in bed.  No visible distress.  Pain well-controlled.  Objective: Vitals:   03/11/24 1905 03/11/24 2013 03/12/24 0348 03/12/24 0753  BP: 110/85 133/80 120/82 116/84  Pulse: 74 81 70 69  Resp:  18 16 20   Temp: 97.9 F (36.6 C) 98.1 F (36.7 C) 98 F (36.7 C) 98.6 F (37 C)  TempSrc:   Axillary Oral Oral  SpO2: 96% 96% 96% 96%  Weight:      Height:        Intake/Output Summary (Last 24 hours) at 03/12/2024 1413 Last data filed at 03/12/2024 0900 Gross per 24 hour  Intake 765 ml  Output 320 ml  Net 445 ml   Filed Weights   03/10/24 1543 03/11/24 0438  Weight: 91.6 kg 93.9 kg    Examination:  General exam: Appears calm and comfortable  Respiratory system: Clear to auscultation. Respiratory effort normal. Cardiovascular system: S1-S2, RRR, no murmurs, no pedal edema Gastrointestinal system: Soft, nondistended, mild TTP, abdominal drain in place Central nervous system: Alert and oriented. No focal neurological deficits. Extremities: Symmetric 5 x 5 power. Skin: No rashes, lesions or ulcers Psychiatry: Judgement and insight appear normal. Mood & affect appropriate.     Data Reviewed: I have personally reviewed following labs and imaging studies  CBC: Recent Labs  Lab 03/10/24 1556 03/11/24 0624 03/12/24 0222  WBC 18.4* 12.9* 11.6*  NEUTROABS  --   --  10.0*  HGB 13.0 11.2* 11.7*  HCT 37.9* 33.3* 35.1*  MCV 96.4 96.0 97.8  PLT 265 219 260   Basic Metabolic Panel: Recent Labs  Lab 03/10/24 1556 03/11/24 0624 03/12/24 0222  NA 132* 134* 139  K 3.8 4.0 5.0  CL 99 103 105  CO2 23 23 23   GLUCOSE 148* 112* 135*  BUN 16 15 14   CREATININE 1.42* 1.11 1.06  CALCIUM  8.9 8.1* 8.5*  MG 2.2  --   --  GFR: Estimated Creatinine Clearance: 71.1 mL/min (by C-G formula based on SCr of 1.06 mg/dL). Liver Function Tests: Recent Labs  Lab 03/10/24 1556 03/11/24 0624  AST 19 14*  ALT 11 11  ALKPHOS 58 52  BILITOT 1.4* 1.3*  PROT 7.2 6.1*  ALBUMIN  3.5 2.9*   Recent Labs  Lab 03/10/24 1556  LIPASE 24   No results for input(s): AMMONIA in the last 168 hours. Coagulation Profile: Recent Labs  Lab 03/10/24 2333  INR 1.1   Cardiac Enzymes: No results for input(s): CKTOTAL, CKMB, CKMBINDEX, TROPONINI in the last 168 hours. BNP (last  3 results) No results for input(s): PROBNP in the last 8760 hours. HbA1C: No results for input(s): HGBA1C in the last 72 hours. CBG: Recent Labs  Lab 03/11/24 0725 03/12/24 0754  GLUCAP 107* 108*   Lipid Profile: No results for input(s): CHOL, HDL, LDLCALC, TRIG, CHOLHDL, LDLDIRECT in the last 72 hours. Thyroid Function Tests: No results for input(s): TSH, T4TOTAL, FREET4, T3FREE, THYROIDAB in the last 72 hours. Anemia Panel: No results for input(s): VITAMINB12, FOLATE, FERRITIN, TIBC, IRON, RETICCTPCT in the last 72 hours. Sepsis Labs: Recent Labs  Lab 03/11/24 0245 03/11/24 0445  LATICACIDVEN 1.1 1.0    Recent Results (from the past 240 hours)  Culture, blood (Routine X 2) w Reflex to ID Panel     Status: None (Preliminary result)   Collection Time: 03/10/24 11:33 PM   Specimen: Right Antecubital; Blood  Result Value Ref Range Status   Specimen Description RIGHT ANTECUBITAL  Final   Special Requests   Final    BOTTLES DRAWN AEROBIC AND ANAEROBIC Blood Culture results may not be optimal due to an inadequate volume of blood received in culture bottles   Culture   Final    NO GROWTH 2 DAYS Performed at [facility], [address], [place], [place] [address]    Report Status PENDING  Incomplete  Culture, blood (Routine X 2) w Reflex to ID Panel     Status: None (Preliminary result)   Collection Time: 03/11/24  4:45 AM   Specimen: BLOOD LEFT ARM  Result Value Ref Range Status   Specimen Description BLOOD LEFT ARM  Final   Special Requests   Final    BOTTLES DRAWN AEROBIC AND ANAEROBIC Blood Culture adequate volume   Culture   Final    NO GROWTH 1 DAY Performed at [facility], [address], [place], [place] [address]    Report Status PENDING  Incomplete         Radiology Studies: CT ABDOMEN PELVIS W CONTRAST Result Date: 03/10/2024 CLINICAL DATA:  Worsening upper quadrant pain fever EXAM: CT ABDOMEN  AND PELVIS WITH CONTRAST TECHNIQUE: Multidetector CT imaging of the abdomen and pelvis was performed using the standard protocol following bolus administration of intravenous contrast. RADIATION DOSE REDUCTION: This exam was performed according to the departmental dose-optimization program which includes automated exposure control, adjustment of the mA and/or kV according to patient size and/or use of iterative reconstruction technique. CONTRAST:  OMNIPAQUE  IOHEXOL  300 MG/ML  SOLN COMPARISON:  Ultrasound 03/10/2024 FINDINGS: Lower chest: Lung bases demonstrate no acute airspace disease. Mitral calcification. Hepatobiliary: No focal hepatic abnormality or biliary dilatation. Slightly distended gallbladder. No calcified stone. Mild pericholecystic stranding Pancreas: Unremarkable. No pancreatic ductal dilatation or surrounding inflammatory changes. Spleen: Normal in size without focal abnormality. Adrenals/Urinary Tract: Adrenal glands are normal. Kidneys show no hydronephrosis. Small left-sided kidney stones measuring up to 5 mm. The bladder is unremarkable  Stomach/Bowel: Stomach is within normal limits. Appendix appears normal. No evidence of bowel wall thickening, distention, or inflammatory changes. Diverticular disease of the left colon. Vascular/Lymphatic: Aortic atherosclerosis. No enlarged abdominal or pelvic lymph nodes. Reproductive: Negative prostate Other: Negative for pelvic effusion or free air Musculoskeletal: No acute or suspicious osseous abnormality IMPRESSION: 1. Slightly distended gallbladder with mild pericholecystic stranding. No calcified stone is seen. CT appearance is suspicious for acute cholecystitis but this was not confirmed on preceding ultrasound, if further imaging is required, correlation with nuclear medicine hepatobiliary imaging could be obtained. 2. Left kidney stones. Diverticular disease of the left colon without acute inflammatory process. 3. Aortic atherosclerosis. Aortic  Atherosclerosis (ICD10-I70.0). Electronically Signed   By: Luke Bun M.D.   On: 03/10/2024 22:18   US  Abdomen Limited RUQ (LIVER/GB) Result Date: 03/10/2024 CLINICAL DATA:  Elevated bilirubin. EXAM: ULTRASOUND ABDOMEN LIMITED RIGHT UPPER QUADRANT COMPARISON:  None Available. FINDINGS: Gallbladder: Gallstones and echogenic sludge are seen within the gallbladder lumen. The largest gallstone measures 5.7 mm. There is no evidence of gallbladder wall thickening (2.3 mm) no sonographic Murphy sign noted by sonographer. Common bile duct: Diameter: 3.6 mm Liver: The left lobe of the liver is limited in evaluation secondary to overlying bowel gas. No focal lesion identified. Within normal limits in parenchymal echogenicity. Portal vein is patent on color Doppler imaging with normal direction of blood flow towards the liver. Other: The study is technically limited secondary to overlying bowel gas, as per the ultrasound technologist. IMPRESSION: 1. Cholelithiasis and gallbladder sludge without evidence of acute cholecystitis. Electronically Signed   By: Suzen Dials M.D.   On: 03/10/2024 22:08        Scheduled Meds:  folic acid   1 mg Oral Daily   LORazepam   0-4 mg Intravenous Q6H   Followed by   NOREEN ON 03/13/2024] LORazepam   0-4 mg Intravenous Q12H   metoprolol  succinate  25 mg Oral Daily   multivitamin with minerals  1 tablet Oral Daily   pantoprazole   40 mg Oral Daily   pravastatin   40 mg Oral q1800   thiamine   100 mg Oral Daily   Or   thiamine   100 mg Intravenous Daily   Continuous Infusions:  piperacillin -tazobactam (ZOSYN )  IV 3.375 g (03/12/24 0549)     LOS: 2 days      Calvin KATHEE Robson, MD [facility]   If 7PM-7AM, please contact night-coverage  03/12/2024, 2:13 PM

## 2024-03-13 DIAGNOSIS — R1011 Right upper quadrant pain: Secondary | ICD-10-CM | POA: Diagnosis not present

## 2024-03-13 LAB — SURGICAL PATHOLOGY

## 2024-03-13 LAB — GLUCOSE, CAPILLARY: Glucose-Capillary: 109 mg/dL — ABNORMAL HIGH (ref 70–99)

## 2024-03-13 MED ORDER — HYDROCODONE-ACETAMINOPHEN 5-325 MG PO TABS: 1.0000 | ORAL_TABLET | Freq: Three times a day (TID) | ORAL | 0 refills | Status: AC | PRN

## 2024-03-13 MED ORDER — AMOXICILLIN-POT CLAVULANATE 875-125 MG PO TABS: 1.0000 | ORAL_TABLET | Freq: Two times a day (BID) | ORAL | 0 refills | Status: AC

## 2024-03-13 NOTE — Discharge Summary (Signed)
 Physician Discharge Summary  Eric Henderson FMW:[id number] DOB: 08-30-1950 DOA: 03/10/2024  PCP: Eric Clotilda DEL, NP  Admit date: 03/10/2024 Discharge date: 03/13/2024  Admitted From: Home Disposition:  Home  Recommendations for Outpatient Follow-up:  Follow up with PCP in 1-2 weeks   Home Health:No Equipment/Devices:None   Discharge Condition:Stable  CODE STATUS:FULL  Diet recommendation: Reg  Brief/Interim Summary:  73yo with h/o HTN, HLD, ETOH use d/o, MGUS, and class 1 obesity who presented on  7/21 with abdominal pain and fever to 103.  CT with mild GB distention, ?acute cholecystitis.  RUQ US  with cholelithiasis and GB sludge without acute cholecystitis.  Surgery consulted.  He was started on Zosyn .  Lap chole on 7/22 with purulent cholecystitis, drain placed in RUQ.     Discharge Diagnoses:  Principal Problem:   Right upper quadrant abdominal pain Active Problems:   Hypertension   Hypercholesteremia   AKI (acute kidney injury) (HCC)   Alcohol abuse   Obesity (BMI 30-39.9)  Acute cholecystitis Clinical presentation c/w cholecystitis and CT findings concur Surgery consulted Underwent lap chole with drain placement on 7/22  Plan: Drain removed on day of discharge.  Placement stable for discharge home.     Discharge Instructions  Discharge Instructions     Diet - low sodium heart healthy   Complete by: As directed    Increase activity slowly   Complete by: As directed       Allergies as of 03/13/2024   No Known Allergies      Medication List     TAKE these medications    amoxicillin -clavulanate 875-125 MG tablet Commonly known as: AUGMENTIN  Take 1 tablet by mouth 2 (two) times daily for 5 days.   azelastine 0.1 % nasal spray Commonly known as: ASTELIN Place 1 spray into the nose 2 (two) times daily as needed for allergies.   Cholecalciferol 125 MCG (5000 UT) capsule Take 5,000 Units by mouth daily.   colchicine 0.6 MG tablet Take 0.6-1.2  mg by mouth as needed. Take 2 tablets (1.2mg ) by mouth at first sign of gout flare followed by 1 tablet (0.6mg ) after 1 hour. (Max 1.8mg  within 1 hour)   cyanocobalamin 1000 MCG tablet Commonly known as: VITAMIN B12 Take 1,000 mcg by mouth daily.   Fish Oil 1000 MG Caps Take 1 capsule by mouth daily.   fluticasone 50 MCG/ACT nasal spray Commonly known as: FLONASE Place 1 spray into the nose 2 (two) times daily as needed for rhinitis.   HYDROcodone -acetaminophen  5-325 MG tablet Commonly known as: NORCO/VICODIN Take 1 tablet by mouth every 8 (eight) hours as needed for up to 3 days.   ipratropium 0.06 % nasal spray Commonly known as: ATROVENT Place 2 sprays into the nose 3 (three) times daily as needed for rhinitis.   lisinopril 20 MG tablet Commonly known as: ZESTRIL Take 20 mg by mouth daily. FOR BLOOD PRESSURE   lovastatin 40 MG tablet Commonly known as: MEVACOR Take 40 mg by mouth 2 (two) times daily. FOR CHOLESTEROL. Needs to schedule follow-up visit with PCP.   meloxicam 7.5 MG tablet Commonly known as: MOBIC Take 7.5 mg by mouth daily.   metoprolol  succinate 25 MG 24 hr tablet Commonly known as: TOPROL -XL Take 25 mg by mouth daily.   omeprazole 40 MG capsule Commonly known as: PRILOSEC TAKE 1 CAPSULE (40 MG TOTAL) BY MOUTH ONCE DAILY PATIENT NEEDS AN APPOINTMENT FOR FURTHER REFILLS.        Follow-up Information  [name], MD Follow up on 03/25/2024.   Specialty: General Surgery Why: 2 week post-op robotic assisted lap cholecystectomy  Go at 8:30am Contact information: [address] [place] [address] [phone]                No Known Allergies  Consultations: General Surgery   Procedures/Studies: CT ABDOMEN PELVIS W CONTRAST Result Date: 03/10/2024 CLINICAL DATA:  Worsening upper quadrant pain fever EXAM: CT ABDOMEN AND PELVIS WITH CONTRAST TECHNIQUE: Multidetector CT imaging of the abdomen and pelvis was  performed using the standard protocol following bolus administration of intravenous contrast. RADIATION DOSE REDUCTION: This exam was performed according to the departmental dose-optimization program which includes automated exposure control, adjustment of the mA and/or kV according to patient size and/or use of iterative reconstruction technique. CONTRAST:  OMNIPAQUE  IOHEXOL  300 MG/ML  SOLN COMPARISON:  Ultrasound 03/10/2024 FINDINGS: Lower chest: Lung bases demonstrate no acute airspace disease. Mitral calcification. Hepatobiliary: No focal hepatic abnormality or biliary dilatation. Slightly distended gallbladder. No calcified stone. Mild pericholecystic stranding Pancreas: Unremarkable. No pancreatic ductal dilatation or surrounding inflammatory changes. Spleen: Normal in size without focal abnormality. Adrenals/Urinary Tract: Adrenal glands are normal. Kidneys show no hydronephrosis. Small left-sided kidney stones measuring up to 5 mm. The bladder is unremarkable Stomach/Bowel: Stomach is within normal limits. Appendix appears normal. No evidence of bowel wall thickening, distention, or inflammatory changes. Diverticular disease of the left colon. Vascular/Lymphatic: Aortic atherosclerosis. No enlarged abdominal or pelvic lymph nodes. Reproductive: Negative prostate Other: Negative for pelvic effusion or free air Musculoskeletal: No acute or suspicious osseous abnormality IMPRESSION: 1. Slightly distended gallbladder with mild pericholecystic stranding. No calcified stone is seen. CT appearance is suspicious for acute cholecystitis but this was not confirmed on preceding ultrasound, if further imaging is required, correlation with nuclear medicine hepatobiliary imaging could be obtained. 2. Left kidney stones. Diverticular disease of the left colon without acute inflammatory process. 3. Aortic atherosclerosis. Aortic Atherosclerosis (ICD10-I70.0). Electronically Signed   By: Luke Bun M.D.   On:  03/10/2024 22:18   US  Abdomen Limited RUQ (LIVER/GB) Result Date: 03/10/2024 CLINICAL DATA:  Elevated bilirubin. EXAM: ULTRASOUND ABDOMEN LIMITED RIGHT UPPER QUADRANT COMPARISON:  None Available. FINDINGS: Gallbladder: Gallstones and echogenic sludge are seen within the gallbladder lumen. The largest gallstone measures 5.7 mm. There is no evidence of gallbladder wall thickening (2.3 mm) no sonographic Murphy sign noted by sonographer. Common bile duct: Diameter: 3.6 mm Liver: The left lobe of the liver is limited in evaluation secondary to overlying bowel gas. No focal lesion identified. Within normal limits in parenchymal echogenicity. Portal vein is patent on color Doppler imaging with normal direction of blood flow towards the liver. Other: The study is technically limited secondary to overlying bowel gas, as per the ultrasound technologist. IMPRESSION: 1. Cholelithiasis and gallbladder sludge without evidence of acute cholecystitis. Electronically Signed   By: Suzen Dials M.D.   On: 03/10/2024 22:08      Subjective: Seen and examined on the day of discharge.  Stable no distress.  Appropriate for discharge home.  Discharge Exam: Vitals:   03/13/24 0544 03/13/24 0803  BP: 131/89 132/87  Pulse: 60 (!) 58  Resp: 17 18  Temp: 97.9 F (36.6 C) 97.6 F (36.4 C)  SpO2: 97% 93%   Vitals:   03/12/24 1537 03/12/24 2101 03/13/24 0544 03/13/24 0803  BP: 115/81 116/85 131/89 132/87  Pulse: 73 68 60 (!) 58  Resp: 16 16 17 18   Temp: 98.4 F (36.9 C)  97.9 F (36.6 C) 97.9 F (36.6 C) 97.6 F (36.4 C)  TempSrc: Oral Oral Oral Oral  SpO2: 96% 97% 97% 93%  Weight:      Height:        General: Pt is alert, awake, not in acute distress Cardiovascular: RRR, S1/S2 +, no rubs, no gallops Respiratory: CTA bilaterally, no wheezing, no rhonchi Abdominal: Soft, NT, ND, bowel sounds + Extremities: no edema, no cyanosis    The results of significant diagnostics from this hospitalization  (including imaging, microbiology, ancillary and laboratory) are listed below for reference.     Microbiology: Recent Results (from the past 240 hours)  Culture, blood (Routine X 2) w Reflex to ID Panel     Status: None (Preliminary result)   Collection Time: 03/10/24 11:33 PM   Specimen: Right Antecubital; Blood  Result Value Ref Range Status   Specimen Description RIGHT ANTECUBITAL  Final   Special Requests   Final    BOTTLES DRAWN AEROBIC AND ANAEROBIC Blood Culture results may not be optimal due to an inadequate volume of blood received in culture bottles   Culture   Final    NO GROWTH 3 DAYS Performed at [facility], [address], [place], [place] [address]    Report Status PENDING  Incomplete  Culture, blood (Routine X 2) w Reflex to ID Panel     Status: None (Preliminary result)   Collection Time: 03/11/24  4:45 AM   Specimen: BLOOD LEFT ARM  Result Value Ref Range Status   Specimen Description BLOOD LEFT ARM  Final   Special Requests   Final    BOTTLES DRAWN AEROBIC AND ANAEROBIC Blood Culture adequate volume   Culture   Final    NO GROWTH 2 DAYS Performed at [facility], [address] [place], [place] [address]    Report Status PENDING  Incomplete     Labs: BNP (last 3 results) No results for input(s): BNP in the last 8760 hours. Basic Metabolic Panel: Recent Labs  Lab 03/10/24 1556 03/11/24 0624 03/12/24 0222  NA 132* 134* 139  K 3.8 4.0 5.0  CL 99 103 105  CO2 23 23 23   GLUCOSE 148* 112* 135*  BUN 16 15 14   CREATININE 1.42* 1.11 1.06  CALCIUM  8.9 8.1* 8.5*  MG 2.2  --   --    Liver Function Tests: Recent Labs  Lab 03/10/24 1556 03/11/24 0624  AST 19 14*  ALT 11 11  ALKPHOS 58 52  BILITOT 1.4* 1.3*  PROT 7.2 6.1*  ALBUMIN  3.5 2.9*   Recent Labs  Lab 03/10/24 1556  LIPASE 24   No results for input(s): AMMONIA in the last 168 hours. CBC: Recent Labs  Lab 03/10/24 1556 03/11/24 0624 03/12/24 0222  WBC 18.4*  12.9* 11.6*  NEUTROABS  --   --  10.0*  HGB 13.0 11.2* 11.7*  HCT 37.9* 33.3* 35.1*  MCV 96.4 96.0 97.8  PLT 265 219 260   Cardiac Enzymes: No results for input(s): CKTOTAL, CKMB, CKMBINDEX, TROPONINI in the last 168 hours. BNP: Invalid input(s): POCBNP CBG: Recent Labs  Lab 03/11/24 0725 03/12/24 0754 03/13/24 0803  GLUCAP 107* 108* 109*   D-Dimer No results for input(s): DDIMER in the last 72 hours. Hgb A1c No results for input(s): HGBA1C in the last 72 hours. Lipid Profile No results for input(s): CHOL, HDL, LDLCALC, TRIG, CHOLHDL, LDLDIRECT in the last 72 hours. Thyroid function studies No results for input(s): TSH, T4TOTAL, T3FREE, THYROIDAB in  the last 72 hours.  Invalid input(s): FREET3 Anemia work up No results for input(s): VITAMINB12, FOLATE, FERRITIN, TIBC, IRON, RETICCTPCT in the last 72 hours. Urinalysis    Component Value Date/Time   COLORURINE AMBER (A) 03/10/2024 1556   APPEARANCEUR HAZY (A) 03/10/2024 1556   LABSPEC 1.027 03/10/2024 1556   PHURINE 5.0 03/10/2024 1556   GLUCOSEU NEGATIVE 03/10/2024 1556   HGBUR NEGATIVE 03/10/2024 1556   BILIRUBINUR NEGATIVE 03/10/2024 1556   KETONESUR 20 (A) 03/10/2024 1556   PROTEINUR 100 (A) 03/10/2024 1556   NITRITE NEGATIVE 03/10/2024 1556   LEUKOCYTESUR NEGATIVE 03/10/2024 1556   Sepsis Labs Recent Labs  Lab 03/10/24 1556 03/11/24 0624 03/12/24 0222  WBC 18.4* 12.9* 11.6*   Microbiology Recent Results (from the past 240 hours)  Culture, blood (Routine X 2) w Reflex to ID Panel     Status: None (Preliminary result)   Collection Time: 03/10/24 11:33 PM   Specimen: Right Antecubital; Blood  Result Value Ref Range Status   Specimen Description RIGHT ANTECUBITAL  Final   Special Requests   Final    BOTTLES DRAWN AEROBIC AND ANAEROBIC Blood Culture results may not be optimal due to an inadequate volume of blood received in culture bottles   Culture   Final     NO GROWTH 3 DAYS Performed at [facility], [address], [place], [place] [address]    Report Status PENDING  Incomplete  Culture, blood (Routine X 2) w Reflex to ID Panel     Status: None (Preliminary result)   Collection Time: 03/11/24  4:45 AM   Specimen: BLOOD LEFT ARM  Result Value Ref Range Status   Specimen Description BLOOD LEFT ARM  Final   Special Requests   Final    BOTTLES DRAWN AEROBIC AND ANAEROBIC Blood Culture adequate volume   Culture   Final    NO GROWTH 2 DAYS Performed at [facility], [address] [place], [place] [address]    Report Status PENDING  Incomplete     Time coordinating discharge: 40 minutes  SIGNED:   Calvin KATHEE Robson, MD  [facility] 03/13/2024, 12:39 PM Pager   If 7PM-7AM, please contact night-coverage

## 2024-03-13 NOTE — Plan of Care (Signed)

## 2024-03-13 NOTE — Progress Notes (Signed)
 Eric Henderson [place] to be discharged Home per MD order. Discussed prescriptions and follow up appointments with the patient. Prescriptions given to patient, medication list explained in detail. Patient verbalized understanding.  Allergies as of 03/13/2024   No Known Allergies      Medication List     TAKE these medications    amoxicillin -clavulanate 875-125 MG tablet Commonly known as: AUGMENTIN  Take 1 tablet by mouth 2 (two) times daily for 5 days.   azelastine 0.1 % nasal spray Commonly known as: ASTELIN Place 1 spray into the nose 2 (two) times daily as needed for allergies.   Cholecalciferol 125 MCG (5000 UT) capsule Take 5,000 Units by mouth daily.   colchicine 0.6 MG tablet Take 0.6-1.2 mg by mouth as needed. Take 2 tablets (1.2mg ) by mouth at first sign of gout flare followed by 1 tablet (0.6mg ) after 1 hour. (Max 1.8mg  within 1 hour)   cyanocobalamin 1000 MCG tablet Commonly known as: VITAMIN B12 Take 1,000 mcg by mouth daily.   Fish Oil 1000 MG Caps Take 1 capsule by mouth daily.   fluticasone 50 MCG/ACT nasal spray Commonly known as: FLONASE Place 1 spray into the nose 2 (two) times daily as needed for rhinitis.   HYDROcodone -acetaminophen  5-325 MG tablet Commonly known as: NORCO/VICODIN Take 1 tablet by mouth every 8 (eight) hours as needed for up to 3 days.   ipratropium 0.06 % nasal spray Commonly known as: ATROVENT Place 2 sprays into the nose 3 (three) times daily as needed for rhinitis.   lisinopril 20 MG tablet Commonly known as: ZESTRIL Take 20 mg by mouth daily. FOR BLOOD PRESSURE   lovastatin 40 MG tablet Commonly known as: MEVACOR Take 40 mg by mouth 2 (two) times daily. FOR CHOLESTEROL. Needs to schedule follow-up visit with PCP.   meloxicam 7.5 MG tablet Commonly known as: MOBIC Take 7.5 mg by mouth daily.   metoprolol  succinate 25 MG 24 hr tablet Commonly known as: TOPROL -XL Take 25 mg by mouth daily.   omeprazole 40 MG  capsule Commonly known as: PRILOSEC TAKE 1 CAPSULE (40 MG TOTAL) BY MOUTH ONCE DAILY PATIENT NEEDS AN APPOINTMENT FOR FURTHER REFILLS.        Vitals:   03/13/24 0544 03/13/24 0803  BP: 131/89 132/87  Pulse: 60 (!) 58  Resp: 17 18  Temp: 97.9 F (36.6 C) 97.6 F (36.4 C)  SpO2: 97% 93%    Skin clean, dry and intact without evidence of skin break down and or skin tears. IV catheter discontinued intact. Site without signs and symptoms of complications. Dressing and pressure applied. Patient denies pain at this time. No complaints noted.  An After Visit Summary was printed and given to the patient. Patient escorted via wheelchair and discharged Home home via private auto.  Hunter JONETTA Hope, RN

## 2024-03-13 NOTE — Progress Notes (Signed)
 [facility]- General Surgery  SURGICAL PROGRESS NOTE  Hospital Day(s): 3.   Post op day(s): 2 Days Post-Op.   Interval History:  Patient seen and examined. No acute events or new complaints overnight.  Patient reports minimal abdominal pain. Has had minimal serounsagious output in JP drain. Has been tolerating regular diet. Denies any nausea or vomiting.    Vital signs in last 24 hours: [min-max] current  Temp:  [97.6 F (36.4 C)-98.4 F (36.9 C)] 97.6 F (36.4 C) (07/24 0803) Pulse Rate:  [58-73] 58 (07/24 0803) Resp:  [16-18] 18 (07/24 0803) BP: (115-132)/(81-89) 132/87 (07/24 0803) SpO2:  [93 %-97 %] 93 % (07/24 0803)     Height: 5' 8 (172.7 cm) Weight: 93.9 kg BMI (Calculated): 31.48   Intake/Output last 2 shifts:  07/23 0701 - 07/24 0700 In: 240 [P.O.:240] Out: 90 [Drains:90]   Physical Exam:  Constitutional: alert, cooperative and no distress  Respiratory: breathing non-labored at rest  Cardiovascular: regular rate and sinus rhythm  Gastrointestinal: soft, mildly tender, and non-distended, removed JP drain   Labs:     Latest Ref Rng & Units 03/12/2024    2:22 AM 03/11/2024    6:24 AM 03/10/2024    3:56 PM  CBC  WBC 4.0 - 10.5 K/uL 11.6  12.9  18.4   Hemoglobin 13.0 - 17.0 g/dL 88.2  88.7  86.9   Hematocrit 39.0 - 52.0 % 35.1  33.3  37.9   Platelets 150 - 400 K/uL 260  219  265       Latest Ref Rng & Units 03/12/2024    2:22 AM 03/11/2024    6:24 AM 03/10/2024    3:56 PM  CMP  Glucose 70 - 99 mg/dL 864  887  851   BUN 8 - 23 mg/dL 14  15  16    Creatinine 0.61 - 1.24 mg/dL 8.93  8.88  8.57   Sodium 135 - 145 mmol/L 139  134  132   Potassium 3.5 - 5.1 mmol/L 5.0  4.0  3.8   Chloride 98 - 111 mmol/L 105  103  99   CO2 22 - 32 mmol/L 23  23  23    Calcium  8.9 - 10.3 mg/dL 8.5  8.1  8.9   Total Protein 6.5 - 8.1 g/dL  6.1  7.2   Total Bilirubin 0.0 - 1.2 mg/dL  1.3  1.4   Alkaline Phos 38 - 126 U/L  52  58   AST 15 - 41 U/L  14  19   ALT 0 - 44 U/L  11  11      Imaging studies: No new pertinent imaging studies   Assessment/Plan:  73 y.o. male with acute cholecystitis  2 Days Post-Op s/p robotic-assisted laparoscopic cholecystectomy, complicated by pertinent comorbidities including hypertension, hypercholesteremia, obesity, GERD, and alcohol abuse.    - Stable vitals signs, no fevers and not tachycardic    - Tolerating diet and ambulating well  - Remove drain   - No outpatient antibiotics necessary  - Patient is clear from surgical standpoint   -- Domique Clapper Barrientos PA-C

## 2024-03-15 LAB — CULTURE, BLOOD (ROUTINE X 2): Culture: NO GROWTH

## 2024-03-16 LAB — CULTURE, BLOOD (ROUTINE X 2)
Culture: NO GROWTH
Special Requests: ADEQUATE

## 2024-03-20 NOTE — Anesthesia Postprocedure Evaluation (Signed)
 Anesthesia Post Note  Patient: Eric Henderson  Procedure(s) Performed: CHOLECYSTECTOMY, ROBOT-ASSISTED, LAPAROSCOPIC INDOCYANINE GREEN  FLUORESCENCE IMAGING (ICG)  Patient location during evaluation: PACU Anesthesia Type: General Level of consciousness: awake Pain management: satisfactory to patient Vital Signs Assessment: post-procedure vital signs reviewed and stable Respiratory status: spontaneous breathing Cardiovascular status: blood pressure returned to baseline Anesthetic complications: no   No notable events documented.   Last Vitals:  Vitals:   03/13/24 0544 03/13/24 0803  BP: 131/89 132/87  Pulse: 60 (!) 58  Resp: 17 18  Temp: 36.6 C 36.4 C  SpO2: 97% 93%    Last Pain:  Vitals:   03/13/24 0803  TempSrc: Oral  PainSc:                  VAN STAVEREN,Sally-Ann Cutbirth

## 2024-05-28 ENCOUNTER — Encounter: Payer: Self-pay | Admitting: Dermatology

## 2024-05-28 ENCOUNTER — Ambulatory Visit: Payer: Medicare HMO | Admitting: Dermatology

## 2024-05-28 DIAGNOSIS — W908XXA Exposure to other nonionizing radiation, initial encounter: Secondary | ICD-10-CM | POA: Diagnosis not present

## 2024-05-28 DIAGNOSIS — D225 Melanocytic nevi of trunk: Secondary | ICD-10-CM | POA: Diagnosis not present

## 2024-05-28 DIAGNOSIS — L821 Other seborrheic keratosis: Secondary | ICD-10-CM

## 2024-05-28 DIAGNOSIS — D2272 Melanocytic nevi of left lower limb, including hip: Secondary | ICD-10-CM | POA: Diagnosis not present

## 2024-05-28 DIAGNOSIS — L570 Actinic keratosis: Secondary | ICD-10-CM

## 2024-05-28 DIAGNOSIS — L814 Other melanin hyperpigmentation: Secondary | ICD-10-CM

## 2024-05-28 DIAGNOSIS — D489 Neoplasm of uncertain behavior, unspecified: Secondary | ICD-10-CM

## 2024-05-28 DIAGNOSIS — L578 Other skin changes due to chronic exposure to nonionizing radiation: Secondary | ICD-10-CM

## 2024-05-28 DIAGNOSIS — Z1283 Encounter for screening for malignant neoplasm of skin: Secondary | ICD-10-CM

## 2024-05-28 DIAGNOSIS — D229 Melanocytic nevi, unspecified: Secondary | ICD-10-CM

## 2024-05-28 DIAGNOSIS — D239 Other benign neoplasm of skin, unspecified: Secondary | ICD-10-CM

## 2024-05-28 HISTORY — DX: Other benign neoplasm of skin, unspecified: D23.9

## 2024-05-28 NOTE — Patient Instructions (Addendum)
 Biopsy Wound Care Instructions  Leave the original bandage on for 24 hours if possible.  If the bandage becomes soaked or soiled before that time, it is OK to remove it and examine the wound.  A small amount of post-operative bleeding is normal.  If excessive bleeding occurs, remove the bandage, place gauze over the site and apply continuous pressure (no peeking) over the area for 30 minutes. If this does not work, please call our clinic as soon as possible or page your doctor if it is after hours.   Once a day, cleanse the wound with soap and water. It is fine to shower. If a thick crust develops you may use a Q-tip dipped into dilute hydrogen peroxide (mix 1:1 with water) to dissolve it.  Hydrogen peroxide can slow the healing process, so use it only as needed.    After washing, apply petroleum jelly (Vaseline) or an antibiotic ointment if your doctor prescribed one for you, followed by a bandage.    For best healing, the wound should be covered with a layer of ointment at all times. If you are not able to keep the area covered with a bandage to hold the ointment in place, this may mean re-applying the ointment several times a day.  Continue this wound care until the wound has healed and is no longer open.   Itching and mild discomfort is normal during the healing process. However, if you develop pain or severe itching, please call our office.   If you have any discomfort, you can take Tylenol (acetaminophen) or ibuprofen as directed on the bottle. (Please do not take these if you have an allergy to them or cannot take them for another reason).  Some redness, tenderness and white or yellow material in the wound is normal healing.  If the area becomes very sore and red, or develops a thick yellow-green material (pus), it may be infected; please notify us .    If you have stitches, return to clinic as directed to have the stitches removed. You will continue wound care for 2-3 days after the stitches  are removed.   Wound healing continues for up to one year following surgery. It is not unusual to experience pain in the scar from time to time during the interval.  If the pain becomes severe or the scar thickens, you should notify the office.    A slight amount of redness in a scar is expected for the first six months.  After six months, the redness will fade and the scar will soften and fade.  The color difference becomes less noticeable with time.  If there are any problems, return for a post-op surgery check at your earliest convenience.  To improve the appearance of the scar, you can use silicone scar gel, cream, or sheets (such as Mederma or Serica) every night for up to one year. These are available over the counter (without a prescription).  Please call our office at [phone] for any questions or concerns.      Cryotherapy Aftercare  Wash gently with soap and water everyday.   Apply Vaseline and Band-Aid daily until healed.   Actinic keratoses are precancerous spots that appear secondary to cumulative UV radiation exposure/sun exposure over time. They are chronic with expected duration over 1 year. A portion of actinic keratoses will progress to squamous cell carcinoma of the skin. It is not possible to reliably predict which spots will progress to skin cancer and so treatment is recommended to  to prevent development of skin cancer.  Recommend daily broad spectrum sunscreen SPF 30+ to sun-exposed areas, reapply every 2 hours as needed.  Recommend staying in the shade or wearing long sleeves, sun glasses (UVA+UVB protection) and wide brim hats (4-inch brim around the entire circumference of the hat). Call for new or changing lesions.    Melanoma ABCDEs  Melanoma is the most dangerous type of skin cancer, and is the leading cause of death from skin disease.  You are more likely to develop melanoma if you: Have light-colored skin, light-colored eyes, or red or blond hair Spend  a lot of time in the sun Tan regularly, either outdoors or in a tanning bed Have had blistering sunburns, especially during childhood Have a close family member who has had a melanoma Have atypical moles or large birthmarks  Early detection of melanoma is key since treatment is typically straightforward and cure rates are extremely high if we catch it early.   The first sign of melanoma is often a change in a mole or a new dark spot.  The ABCDE system is a way of remembering the signs of melanoma.  A for asymmetry:  The two halves do not match. B for border:  The edges of the growth are irregular. C for color:  A mixture of colors are present instead of an even brown color. D for diameter:  Melanomas are usually (but not always) greater than 6mm - the size of a pencil eraser. E for evolution:  The spot keeps changing in size, shape, and color.  Please check your skin once per month between visits. You can use a small mirror in front and a large mirror behind you to keep an eye on the back side or your body.   If you see any new or changing lesions before your next follow-up, please call to schedule a visit.  Please continue daily skin protection including broad spectrum sunscreen SPF 30+ to sun-exposed areas, reapplying every 2 hours as needed when you're outdoors.   Staying in the shade or wearing long sleeves, sun glasses (UVA+UVB protection) and wide brim hats (4-inch brim around the entire circumference of the hat) are also recommended for sun protection.    Due to recent changes in healthcare laws, you may see results of your pathology and/or laboratory studies on MyChart before the doctors have had a chance to review them. We understand that in some cases there may be results that are confusing or concerning to you. Please understand that not all results are received at the same time and often the doctors may need to interpret multiple results in order to provide you with the best plan  of care or course of treatment. Therefore, we ask that you please give us  2 business days to thoroughly review all your results before contacting the office for clarification. Should we see a critical lab result, you will be contacted sooner.   If You Need Anything After Your Visit  If you have any questions or concerns for your doctor, please call our main line at [phone] and press option 4 to reach your doctor's medical assistant. If no one answers, please leave a voicemail as directed and we will return your call as soon as possible. Messages left after 4 pm will be answered the following business day.   You may also send us  a message via MyChart. We typically respond to MyChart messages within 1-2 business days.  For prescription refills, please ask your pharmacy  your pharmacy to contact our office. Our fax number is [phone].  If you have an urgent issue when the clinic is closed that cannot wait until the next business day, you can page your doctor at the number below.    Please note that while we do our best to be available for urgent issues outside of office hours, we are not available 24/7.   If you have an urgent issue and are unable to reach us , you may choose to seek medical care at your doctor's office, retail clinic, urgent care center, or emergency room.  If you have a medical emergency, please immediately call 911 or go to the emergency department.  Pager Numbers  - Dr. Hester: [phone]  - Dr. Jackquline: [phone]  - Dr. Claudene: [phone]   - Dr. Raymund: [phone]  In the event of inclement weather, please call our main line at [phone] for an update on the status of any delays or closures.  Dermatology Medication Tips: Please keep the boxes that topical medications come in in order to help keep track of the instructions about where and how to use these. Pharmacies typically print the medication instructions only on the boxes and not directly on  the medication tubes.   If your medication is too expensive, please contact our office at [phone] option 4 or send us  a message through MyChart.   We are unable to tell what your co-pay for medications will be in advance as this is different depending on your insurance coverage. However, we may be able to find a substitute medication at lower cost or fill out paperwork to get insurance to cover a needed medication.   If a prior authorization is required to get your medication covered by your insurance company, please allow us  1-2 business days to complete this process.  Drug prices often vary depending on where the prescription is filled and some pharmacies may offer cheaper prices.  The website www.goodrx.com contains coupons for medications through different pharmacies. The prices here do not account for what the cost may be with help from insurance (it may be cheaper with your insurance), but the website can give you the price if you did not use any insurance.  - You can print the associated coupon and take it with your prescription to the pharmacy.  - You may also stop by our office during regular business hours and pick up a GoodRx coupon card.  - If you need your prescription sent electronically to a different pharmacy, notify our office through [facility] or by phone at [phone] option 4.     Si Usted Necesita Algo Despus de Su Visita  Tambin puede enviarnos un mensaje a travs de Clinical cytogeneticist. Por lo general respondemos a los mensajes de MyChart en el transcurso de 1 a 2 das hbiles.  Para renovar recetas, por favor pida a su farmacia que se ponga en contacto con nuestra oficina. Randi lakes de fax es [place] [phone].  Si tiene un asunto urgente cuando la clnica est cerrada y que no puede esperar hasta el siguiente da hbil, puede llamar/localizar a su doctor(a) al nmero que aparece a continuacin.   Por favor, tenga en cuenta que aunque hacemos todo lo  posible para estar disponibles para asuntos urgentes fuera del horario de [place], no estamos disponibles las 24 horas del da, los 7 [address]  [address] de la [place].   Si tiene un problema urgente y no puede comunicarse con nosotros, puede optar por buscar atencin mdica  en el consultorio de su doctor(a), en una clnica privada, en un centro de atencin urgente o en una sala de emergencias.  Si tiene Engineer, drilling, por favor llame inmediatamente al 911 o vaya a la sala de emergencias.  Nmeros de bper  - Dr. Hester: [phone]  - Dra. Jackquline: [phone]  - Dr. Claudene: [phone]  - Dra. Kitts: [phone]  En caso de inclemencias del [place], por favor llame a nuestra lnea principal al [phone] para una actualizacin sobre el estado de cualquier retraso o cierre.  Consejos para la medicacin en dermatologa: Por favor, guarde las cajas en las que vienen los medicamentos de uso tpico para ayudarle a seguir las instrucciones sobre dnde y cmo usarlos. Las farmacias generalmente imprimen las instrucciones del medicamento slo en las cajas y no directamente en los tubos del [place].   Si su medicamento es muy caro, por favor, pngase en contacto con landry rieger llamando al [phone] y presione la opcin 4 o envenos un mensaje a travs de Clinical cytogeneticist.   No podemos decirle cul ser su copago por los medicamentos por adelantado ya que esto es diferente dependiendo de la cobertura de su seguro. Sin embargo, es posible que podamos encontrar un medicamento sustituto a Audiological scientist un formulario para que el seguro cubra el medicamento que se considera necesario.   Si se requiere una autorizacin previa para que su compaa de seguros malta su medicamento, por favor permtanos de 1 a 2 das hbiles para completar este proceso.  Los precios de los medicamentos varan con frecuencia dependiendo del Environmental consultant de dnde se surte la receta y alguna farmacias pueden ofrecer precios  ms baratos.  El sitio web www.goodrx.com tiene cupones para medicamentos de Health and safety inspector. Los precios aqu no tienen en cuenta lo que podra costar con la ayuda del seguro (puede ser ms barato con su seguro), pero el sitio web puede darle el precio si no utiliz Tourist information centre manager.  - Puede imprimir el cupn correspondiente y llevarlo con su receta a la farmacia.  - Tambin puede pasar por nuestra oficina durante el horario de atencin regular y Education officer, museum una tarjeta de cupones de GoodRx.  - Si necesita que su receta se enve electrnicamente a una farmacia diferente, informe a nuestra oficina a travs de MyChart de [place] o por telfono llamando al [phone] y presione la opcin 4.

## 2024-05-28 NOTE — Progress Notes (Signed)
 Follow-Up Visit   Subjective  Eric Henderson is a 73 y.o. male who presents for the following: Skin Cancer Screening and Full Body Skin Exam Hx of aks, isks,   Left medial proximal pretibial 0.5 regular brown macule  Photo today nevus   The patient presents for Total-Body Skin Exam (TBSE) for skin cancer screening and mole check. The patient has spots, moles and lesions to be evaluated, some may be new or changing and the patient may have concern these could be cancer.  The following portions of the chart were reviewed this encounter and updated as appropriate: medications, allergies, medical history  Review of Systems:  No other skin or systemic complaints except as noted in HPI or Assessment and Plan.  Objective  Well appearing patient in no apparent distress; mood and affect are within normal limits.  A full examination was performed including scalp, head, eyes, ears, nose, lips, neck, chest, axillae, abdomen, back, buttocks, bilateral upper extremities, bilateral lower extremities, hands, feet, fingers, toes, fingernails, and toenails. All findings within normal limits unless otherwise noted below.   Relevant physical exam findings are noted in the Assessment and Plan. Nevus Left medial proximal pretibial   Nevus Left medial proximal pretibial     left mid back paraspinal 0.6 cm brown macule   left proximal posterior thigh 0.6 cm brown macule   face and ears x 6 (6) Erythematous thin papules/macules with gritty scale.   Assessment & Plan   SKIN CANCER SCREENING PERFORMED TODAY.  ACTINIC DAMAGE - Chronic condition, secondary to cumulative UV/sun exposure - diffuse scaly erythematous macules with underlying dyspigmentation - Recommend daily broad spectrum sunscreen SPF 30+ to sun-exposed areas, reapply every 2 hours as needed.  - Staying in the shade or wearing long sleeves, sun glasses (UVA+UVB protection) and wide brim hats (4-inch brim around the entire  circumference of the hat) are also recommended for sun protection.  - Call for new or changing lesions.  LENTIGINES, SEBORRHEIC KERATOSES, HEMANGIOMAS - Benign normal skin lesions - Benign-appearing - Call for any changes  MELANOCYTIC NEVI - Left medial proximal pretibial 0.5 regular brown macule see photos   - Tan-brown and/or pink-flesh-colored symmetric macules and papules - Benign appearing on exam today - Observation - Call clinic for new or changing moles - Recommend daily use of broad spectrum spf 30+ sunscreen to sun-exposed areas.  Benign-appearing.  Observation.  Call clinic for new or changing lesions.  Recommend daily use of broad spectrum spf 30+ sunscreen to sun-exposed areas.   NEOPLASM OF UNCERTAIN BEHAVIOR (2) left mid back paraspinal Epidermal / dermal shaving  Lesion diameter (cm):  0.6 Informed consent: discussed and consent obtained   Timeout: patient name, date of birth, surgical site, and procedure verified   Procedure prep:  Patient was prepped and draped in usual sterile fashion Prep type:  Isopropyl alcohol Anesthesia: the lesion was anesthetized in a standard fashion   Anesthetic:  1% lidocaine  w/ epinephrine  1-100,000 buffered w/ 8.4% NaHCO3 Instrument used: flexible razor blade   Hemostasis achieved with: pressure, aluminum chloride and electrodesiccation   Outcome: patient tolerated procedure well   Post-procedure details: sterile dressing applied and wound care instructions given   Dressing type: bandage and petrolatum    Specimen 1 - Surgical pathology Differential Diagnosis: nevus r/o dysplasia   Check Margins: yes left proximal posterior thigh Epidermal / dermal shaving  Lesion diameter (cm):  0.6 Informed consent: discussed and consent obtained   Timeout: patient name, date of birth,  surgical site, and procedure verified   Procedure prep:  Patient was prepped and draped in usual sterile fashion Prep type:  Isopropyl alcohol Anesthesia:  the lesion was anesthetized in a standard fashion   Anesthetic:  1% lidocaine  w/ epinephrine  1-100,000 buffered w/ 8.4% NaHCO3 Instrument used: flexible razor blade   Hemostasis achieved with: pressure, aluminum chloride and electrodesiccation   Outcome: patient tolerated procedure well   Post-procedure details: sterile dressing applied and wound care instructions given   Dressing type: bandage and petrolatum    Specimen 2 - Surgical pathology Differential Diagnosis: nevus r/o dysplasia   Check Margins: yes ACTINIC KERATOSIS (6) face and ears x 6 (6) Actinic keratoses are precancerous spots that appear secondary to cumulative UV radiation exposure/sun exposure over time. They are chronic with expected duration over 1 year. A portion of actinic keratoses will progress to squamous cell carcinoma of the skin. It is not possible to reliably predict which spots will progress to skin cancer and so treatment is recommended to prevent development of skin cancer.  Recommend daily broad spectrum sunscreen SPF 30+ to sun-exposed areas, reapply every 2 hours as needed.  Recommend staying in the shade or wearing long sleeves, sun glasses (UVA+UVB protection) and wide brim hats (4-inch brim around the entire circumference of the hat). Call for new or changing lesions. Destruction of lesion - face and ears x 6 (6) Complexity: simple   Destruction method: cryotherapy   Informed consent: discussed and consent obtained   Timeout:  patient name, date of birth, surgical site, and procedure verified Lesion destroyed using liquid nitrogen: Yes   Region frozen until ice ball extended beyond lesion: Yes   Outcome: patient tolerated procedure well with no complications   Post-procedure details: wound care instructions given    Return in about 1 year (around 05/28/2025) for TBSE.  IEleanor Henderson, CMA, am acting as scribe for Eric Rhyme, MD.   Documentation: I have reviewed the above documentation for  accuracy and completeness, and I agree with the above.  Eric Rhyme, MD

## 2024-06-02 ENCOUNTER — Ambulatory Visit: Payer: Self-pay | Admitting: Dermatology

## 2024-06-02 LAB — SURGICAL PATHOLOGY

## 2024-06-03 ENCOUNTER — Encounter: Payer: Self-pay | Admitting: Dermatology

## 2024-06-03 NOTE — Telephone Encounter (Signed)
-----   Message from Alm Rhyme sent at 06/02/2024  5:37 PM EDT ----- FINAL DIAGNOSIS        1. Skin, left mid back paraspinal :       DYSPLASTIC JUNCTIONAL NEVUS WITH MODERATE ATYPIA, LIMITED MARGINS FREE        2. Skin, left proximal posterior thigh :       JUNCTIONAL DYSPLASTIC MELANOCYTIC NEVUS WITH MODERATE TO SEVERE ATYPIA, CLOSE TO       MARGIN, SEE DESCRIPTION    1- Moderate Dysplastic Recheck next visit  2- Moderate to Severe Dysplastic Margins free, but CLOSE TO MARGIN May need additional procedure Recheck in 6 mos Make pt appt in 6 mos ----- Message ----- From: Interface, Lab In [facility] Sent: 06/02/2024   4:05 PM EDT To: Alm JAYSON Rhyme, MD

## 2024-06-03 NOTE — Telephone Encounter (Addendum)
 Tried calling patient regarding bx results. No answer. Unable to leave message - vm was full - will retry later ----- Message from Alm Rhyme sent at 06/02/2024  5:37 PM EDT ----- FINAL DIAGNOSIS        1. Skin, left mid back paraspinal :       DYSPLASTIC JUNCTIONAL NEVUS WITH MODERATE ATYPIA, LIMITED MARGINS FREE        2. Skin, left proximal posterior thigh :       JUNCTIONAL DYSPLASTIC MELANOCYTIC NEVUS WITH MODERATE TO SEVERE ATYPIA, CLOSE TO       MARGIN, SEE DESCRIPTION    1- Moderate Dysplastic Recheck next visit  2- Moderate to Severe Dysplastic Margins free, but CLOSE TO MARGIN May need additional procedure Recheck in 6 mos Make pt appt in 6 mos ----- Message ----- From: Interface, Lab In [facility] Sent: 06/02/2024   4:05 PM EDT To: Alm JAYSON Rhyme, MD

## 2024-06-03 NOTE — Telephone Encounter (Signed)
 Discussed pathology results. 6 month recheck scheduled. JP

## 2024-12-03 ENCOUNTER — Ambulatory Visit: Admitting: Dermatology

## 2025-06-03 ENCOUNTER — Ambulatory Visit: Admitting: Dermatology
